# Patient Record
Sex: Female | Born: 1985 | Hispanic: Yes | Marital: Married | Smoking: Never smoker
Health system: Southern US, Community
[De-identification: ages and names within clinical notes are randomized; demographics above are authoritative.]

## PROBLEM LIST (undated history)

## (undated) DIAGNOSIS — G43909 Migraine, unspecified, not intractable, without status migrainosus: Secondary | ICD-10-CM

## (undated) DIAGNOSIS — D649 Anemia, unspecified: Secondary | ICD-10-CM

## (undated) HISTORY — PX: CHOLECYSTECTOMY: SHX55

## (undated) HISTORY — DX: Anemia, unspecified: D64.9

## (undated) HISTORY — DX: Migraine, unspecified, not intractable, without status migrainosus: G43.909

---

## 2011-03-11 ENCOUNTER — Ambulatory Visit: Payer: Self-pay | Admitting: Family Medicine

## 2011-04-10 ENCOUNTER — Encounter: Payer: Self-pay | Admitting: Obstetrics & Gynecology

## 2011-06-16 ENCOUNTER — Observation Stay: Payer: Self-pay | Admitting: Obstetrics and Gynecology

## 2011-06-16 ENCOUNTER — Inpatient Hospital Stay: Payer: Self-pay | Admitting: Obstetrics and Gynecology

## 2013-07-11 ENCOUNTER — Ambulatory Visit: Payer: Self-pay | Admitting: Advanced Practice Midwife

## 2013-08-12 ENCOUNTER — Ambulatory Visit: Payer: Self-pay | Admitting: Family Medicine

## 2013-12-01 ENCOUNTER — Inpatient Hospital Stay: Payer: Self-pay

## 2013-12-01 LAB — CBC WITH DIFFERENTIAL/PLATELET
BASOS PCT: 0.2 %
Basophil #: 0 10*3/uL (ref 0.0–0.1)
EOS ABS: 0 10*3/uL (ref 0.0–0.7)
Eosinophil %: 0.2 %
HCT: 33 % — ABNORMAL LOW (ref 35.0–47.0)
HGB: 11.1 g/dL — ABNORMAL LOW (ref 12.0–16.0)
Lymphocyte #: 0.8 10*3/uL — ABNORMAL LOW (ref 1.0–3.6)
Lymphocyte %: 4.7 %
MCH: 26.6 pg (ref 26.0–34.0)
MCHC: 33.7 g/dL (ref 32.0–36.0)
MCV: 79 fL — AB (ref 80–100)
MONOS PCT: 4.1 %
Monocyte #: 0.7 x10 3/mm (ref 0.2–0.9)
NEUTROS ABS: 15.3 10*3/uL — AB (ref 1.4–6.5)
Neutrophil %: 90.8 %
Platelet: 219 10*3/uL (ref 150–440)
RBC: 4.17 10*6/uL (ref 3.80–5.20)
RDW: 14.3 % (ref 11.5–14.5)
WBC: 16.9 10*3/uL — ABNORMAL HIGH (ref 3.6–11.0)

## 2013-12-01 LAB — HEPATIC FUNCTION PANEL A (ARMC)
ALBUMIN: 2.3 g/dL — AB (ref 3.4–5.0)
Alkaline Phosphatase: 139 U/L — ABNORMAL HIGH
Bilirubin, Direct: <0.1 mg/dL (ref 0.00–0.20)
Bilirubin,Total: 0.3 mg/dL (ref 0.2–1.0)
SGOT(AST): 26 U/L (ref 15–37)
SGPT (ALT): 18 U/L (ref 12–78)
Total Protein: 6 g/dL — ABNORMAL LOW (ref 6.4–8.2)

## 2013-12-01 LAB — LIPASE, BLOOD: Lipase: 118 U/L (ref 73–393)

## 2013-12-02 LAB — HEMOGLOBIN: HGB: 9.8 g/dL — ABNORMAL LOW (ref 12.0–16.0)

## 2014-08-10 DIAGNOSIS — Z98891 History of uterine scar from previous surgery: Secondary | ICD-10-CM | POA: Insufficient documentation

## 2014-10-17 ENCOUNTER — Ambulatory Visit: Payer: Self-pay | Admitting: Advanced Practice Midwife

## 2014-11-09 ENCOUNTER — Encounter: Payer: Self-pay | Admitting: Obstetrics and Gynecology

## 2014-11-13 ENCOUNTER — Encounter: Payer: Self-pay | Admitting: Obstetrics and Gynecology

## 2014-12-06 DIAGNOSIS — Z550 Illiteracy and low-level literacy: Secondary | ICD-10-CM | POA: Insufficient documentation

## 2015-02-07 ENCOUNTER — Inpatient Hospital Stay
Admit: 2015-02-07 | Disposition: A | Discharge status: Home / Self Care | Payer: Medicaid Other | Attending: Obstetrics & Gynecology | Admitting: Obstetrics & Gynecology

## 2015-02-07 LAB — HEMATOCRIT: HCT: 31.4 % — ABNORMAL LOW (ref 35.0–47.0)

## 2015-02-20 NOTE — H&P (Signed)
L&D Evaluation:  History Expanded:  HPI 29 yo G3P1102 HF at 36 weeks w contractions that started at 0300, min bleeding or leakage but unclear if this happened on way to hospital.  Prenatal Care at [facility]; prior Cesarean Section and then VBAC at 34 weeks (precipitous).   Patient presented and within minutes was pushing breech baby, w delivery by RN team prior to arrival of MD.   Rhett BannisterGravida 3   Term 1   PreTerm 1   Abortion 0   Living 2   Blood Type (Maternal) O positive   Group B Strep Results Maternal (Result >5wks must be treated as unknown) unknown/result > 5 weeks ago   [facility]   Presents with contractions   Patient's Medical History No Chronic Illness   Patient's Surgical History Previous C-Section   Medications Pre Natal Vitamins   Allergies NKDA   Social History none   Family History Non-Contributory   ROS:  ROS All systems were reviewed.  HEENT, CNS, GI, GU, Respiratory, CV, Renal and Musculoskeletal systems were found to be normal.   Exam:  Vital Signs stable   General no apparent distress   Mental Status clear   Abdomen TENDER w postpartum CTXS   Edema no edema   Pelvic no external lesions, cervix healthy; 2nd degree lac noted   Skin no lesions   Impression:  Impression Precipitous delivery of breech VBAC   Plan:  Plan Repair laceration, deliver placenta, and monitor for postpartum care   Electronic Signatures: Sandra Maynard, Bastien Strawser Maynard (MD)  (Signed 27-Apr-16 05:14)  Authored: L&D Evaluation   Last Updated: 27-Apr-16 05:14 by Sandra Maynard, Sandra Maynard (MD)

## 2015-02-20 NOTE — H&P (Signed)
L&D Evaluation:  History:  HPI 29 yo G2P1 at 34+6 weeks presents from ed in active labor and urge to push. Previous ltcs for cpd .   Presents with contractions   Patient's Medical History No Chronic Illness   Patient's Surgical History Previous C-Section   Allergies NKDA   Social History none   Family History Non-Contributory   ROS:  ROS All systems were reviewed.  HEENT, CNS, GI, GU, Respiratory, CV, Renal and Musculoskeletal systems were found to be normal.   Exam:  Vital Signs stable   General no apparent distress   Mental Status clear   Chest clear   Heart normal sinus rhythm   Edema no edema   Pelvic c/c +2/ vtx   Mebranes Ruptured   FHT normal rate with no decels   Ucx regular   Impression:  Impression 34+ 6 week previous c/s   Plan:  Plan precipitous svd ( VBAC) preterm   Electronic Signatures: Jelene Albano, Ihor Austinhomas J (MD)  (Signed 19-Feb-15 16:17)  Authored: L&D Evaluation   Last Updated: 19-Feb-15 16:17 by Suzy BouchardSchermerhorn, Kain Milosevic J (MD)

## 2017-12-15 DIAGNOSIS — F1520 Other stimulant dependence, uncomplicated: Secondary | ICD-10-CM | POA: Insufficient documentation

## 2017-12-15 DIAGNOSIS — E669 Obesity, unspecified: Secondary | ICD-10-CM | POA: Insufficient documentation

## 2017-12-15 LAB — HM HIV SCREENING LAB: HM HIV Screening: NEGATIVE

## 2017-12-15 LAB — HM PAP SMEAR: HM Pap smear: NEGATIVE

## 2020-01-06 ENCOUNTER — Ambulatory Visit: Payer: Self-pay | Attending: Internal Medicine

## 2020-01-06 DIAGNOSIS — Z23 Encounter for immunization: Secondary | ICD-10-CM

## 2020-01-06 NOTE — Progress Notes (Signed)
   Covid-19 Vaccination Clinic  Name:  Sandra Maynard    MRN: [id number] DOB: 06/16/1986  01/06/2020  [name] was observed post Covid-19 immunization for 15 minutes without incident. She was provided with Vaccine Information Sheet and instruction to access the V-Safe system.   [name] was instructed to call 911 with any severe reactions post vaccine: Marland Kitchen Difficulty breathing  . Swelling of face and throat  . A fast heartbeat  . A bad rash all over body  . Dizziness and weakness   Immunizations Administered    Name Date Dose VIS Date Route   Pfizer COVID-19 Vaccine 01/06/2020  4:58 PM 0.3 mL 09/23/2019 Intramuscular   Manufacturer: [employer], [employer]   Lot: [id number]   NDC: [address]-0      Covid-19 Vaccination Clinic  Name:  Sandra Maynard    MRN: [id number] DOB: 05/08/1986  01/06/2020  [name] was observed post Covid-19 immunization for 15 minutes without incident. She was provided with Vaccine Information Sheet and instruction to access the V-Safe system.   [name] was instructed to call 911 with any severe reactions post vaccine: Marland Kitchen Difficulty breathing  . Swelling of face and throat  . A fast heartbeat  . A bad rash all over body  . Dizziness and weakness   Immunizations Administered    Name Date Dose VIS Date Route   Pfizer COVID-19 Vaccine 01/06/2020  4:58 PM 0.3 mL 09/23/2019 Intramuscular   Manufacturer: [employer], [employer]   Lot: [id number]   NDC: [address]-4

## 2020-01-27 ENCOUNTER — Ambulatory Visit: Payer: MEDICAID | Attending: Internal Medicine

## 2020-01-27 DIAGNOSIS — Z23 Encounter for immunization: Secondary | ICD-10-CM

## 2020-01-27 NOTE — Progress Notes (Signed)
   Covid-19 Vaccination Clinic  Name:  Sandra Maynard    MRN: [id number] DOB: [birth year]  01/27/2020  Ms. Sandra Maynard was observed post Covid-19 immunization for 15 minutes without incident. She was provided with Vaccine Information Sheet and instruction to access the V-Safe system.   Ms. Sandra Maynard was instructed to call 911 with any severe reactions post vaccine: Marland Kitchen Difficulty breathing  . Swelling of face and throat  . A fast heartbeat  . A bad rash all over body  . Dizziness and weakness   Immunizations Administered    Name Date Dose VIS Date Route   Pfizer COVID-19 Vaccine 01/27/2020 11:41 AM 0.3 mL 09/23/2019 Intramuscular   Manufacturer: [employer], [employer]   Lot: [id number]   NDC: [address]-5     2

## 2020-05-30 DIAGNOSIS — Z6836 Body mass index (BMI) 36.0-36.9, adult: Secondary | ICD-10-CM

## 2020-05-30 DIAGNOSIS — Z98891 History of uterine scar from previous surgery: Secondary | ICD-10-CM

## 2020-05-30 DIAGNOSIS — F1520 Other stimulant dependence, uncomplicated: Secondary | ICD-10-CM

## 2020-05-30 DIAGNOSIS — Z550 Illiteracy and low-level literacy: Secondary | ICD-10-CM

## 2020-05-30 DIAGNOSIS — E669 Obesity, unspecified: Secondary | ICD-10-CM

## 2020-08-06 ENCOUNTER — Ambulatory Visit: Payer: Self-pay

## 2020-08-31 ENCOUNTER — Ambulatory Visit (LOCAL_COMMUNITY_HEALTH_CENTER): Payer: Self-pay | Admitting: Physician Assistant

## 2020-08-31 ENCOUNTER — Ambulatory Visit: Payer: Self-pay

## 2020-08-31 ENCOUNTER — Encounter: Payer: Self-pay | Admitting: Physician Assistant

## 2020-08-31 ENCOUNTER — Other Ambulatory Visit: Payer: Self-pay

## 2020-08-31 VITALS — BP 137/92 | Ht 59.0 in | Wt 179.4 lb

## 2020-08-31 DIAGNOSIS — Z0000 Encounter for general adult medical examination without abnormal findings: Secondary | ICD-10-CM

## 2020-08-31 DIAGNOSIS — Z113 Encounter for screening for infections with a predominantly sexual mode of transmission: Secondary | ICD-10-CM

## 2020-08-31 DIAGNOSIS — Z3046 Encounter for surveillance of implantable subdermal contraceptive: Secondary | ICD-10-CM

## 2020-08-31 DIAGNOSIS — Z3009 Encounter for other general counseling and advice on contraception: Secondary | ICD-10-CM

## 2020-08-31 NOTE — Progress Notes (Signed)
Pt is here for physical and Nexplanon removal. Pt reports desires pregnancy. Pt's BP is 137/92. Pt reports she has headaches that come and go but none at this time. RN counseling for Nexplanon removal completed and consent form reviewed and signed by pt. Pt accepts condoms today.

## 2020-08-31 NOTE — Progress Notes (Signed)
Repeat BP 133/93 and provider made aware. Pt counseled per Sadie Haber, PA verbal order for pt to follow up with PCP for BP and headaches that pt has been having, and pt states understanding. Pt aware that if she changes her mind and desires birth control later on to give [place] a call back so she can RTC and pt states understanding. Provider orders completed.

## 2020-09-01 ENCOUNTER — Encounter: Payer: Self-pay | Admitting: Physician Assistant

## 2020-09-01 NOTE — Addendum Note (Signed)
Addended by: Sadie Haber on: 09/01/2020 12:39 PM   Modules accepted: Orders

## 2020-09-01 NOTE — Progress Notes (Signed)
Family Planning Visit- Repeat Yearly Visit  Subjective:  Sandra Maynard is a 34 y.o. [phone]  being seen today for an well woman visit and to discuss family planning options.    She is currently using Nexplanon for pregnancy prevention. Patient reports she doeswant a pregnancy in the next year. Patient  has Caffeine dependence (HCC); Obesity, unspecified; Low-level of literacy; and History of cesarean section on their problem list.  Chief Complaint  Patient presents with  . Contraception    Physical and Nexplanon removal    Patient reports that she would like her Nexplanon removed and to get pregnant.  States that she also wants to see if by getting her Nexplanon removed she would have fewer headaches.  States that she does have a PCP that follows her for her headaches.  Reports that she has a Rx that she was given at the hospital for her headaches but she does not know the name of the medicine.  Patient also mentions that she will occasionally have blurry vision that is sometimes associated with her headaches and sometimes associated with her being over tired.   Per chart review, CBE due 2022 and pap due 2024.  Patient denies any other concerns.   See flowsheet for other program required questions.   Body mass index is 36.23 kg/m. - Patient is eligible for diabetes screening based on BMI and age >21?  not applicable HA1C ordered? not applicable  Patient reports 1  partner in last year. Desires STI screening?  No - patient declines.   Has patient been screened once for HCV in the past?  No  No results found for: HCVAB  Does the patient have current of drug use, have a partner with drug use, and/or has been incarcerated since last result? No  If yes-- Screen for HCV through [facility]   Does the patient meet criteria for HBV testing? No  Criteria:  -Household, sexual or needle sharing contact with HBV -History of drug use -HIV positive -Those with known Hep  C   Health Maintenance Due  Topic Date Due  . Hepatitis C Screening  Never done  . TETANUS/TDAP  Never done  . INFLUENZA VACCINE  Never done    Review of Systems  All other systems reviewed and are negative.   The following portions of the patient's history were reviewed and updated as appropriate: allergies, current medications, past family history, past medical history, past social history, past surgical history and problem list. Problem list updated.  Objective:   Vitals:   08/31/20 1034  BP: (!) 137/92  Weight: 179 lb 6.4 oz (81.4 kg)  Height: 4\' 11"  (1.499 m)    Physical Exam Vitals and nursing note reviewed.  Constitutional:      General: She is not in acute distress.    Appearance: Normal appearance.  HENT:     Head: Normocephalic and atraumatic.     Mouth/Throat:     Mouth: Mucous membranes are moist.     Pharynx: Oropharynx is clear. No oropharyngeal exudate or posterior oropharyngeal erythema.  Eyes:     Conjunctiva/sclera: Conjunctivae normal.  Cardiovascular:     Rate and Rhythm: Normal rate and regular rhythm.  Pulmonary:     Effort: Pulmonary effort is normal.     Breath sounds: Normal breath sounds.  Abdominal:     Palpations: Abdomen is soft. There is no mass.     Tenderness: There is no abdominal tenderness. There is no guarding or rebound.  Musculoskeletal:     Cervical back: Neck supple. No tenderness.  Skin:    General: Skin is warm and dry.     Findings: No bruising, erythema, lesion or rash.  Neurological:     Mental Status: She is alert and oriented to person, place, and time.  Psychiatric:        Mood and Affect: Mood normal.        Behavior: Behavior normal.        Thought Content: Thought content normal.        Judgment: Judgment normal.       Assessment and Plan:  Sandra Maynard is a 34 y.o. female [phone] presenting to the [facility] Department for an yearly well woman exam/family planning  visit  Contraception counseling: Reviewed all forms of birth control options in the tiered based approach. available including abstinence; over the counter/barrier methods; hormonal contraceptive medication including pill, patch, ring, injection,contraceptive implant, ECP; hormonal and nonhormonal IUDs; permanent sterilization options including vasectomy and the various tubal sterilization modalities. Risks, benefits, and typical effectiveness rates were reviewed.  Questions were answered.  Written information was also given to the patient to review.  Patient desires Nexplanon removal, this was prescribed for patient. She will follow up in  1 year and prn for surveillance.  She was told to call with any further questions, or with any concerns about this method of contraception.  Emphasized use of condoms 100% of the time for STI prevention.  Patient was not a candidate for ECP today.  1. Encounter for counseling regarding contraception Counseled patient that she can RTC at any time if she changes her mind and wants to delay pregnancy. Enc condoms with all sex until has seen PCP for evaluation of elevated BP.  2. Well woman exam (no gynecological exam) Reviewed with patient healthy habits to maintain general health.  Enc patient to decrease caffeine, get regular sleep, eat regular meals and avoid tobacco, EtOH, and other drugs for healthy pregnancy. Enc MVI 1 po daily. Enc to establish with/ follow up with PCP for evaluation of elevated BP, headaches,  primary care concerns, age appropriate screenings and illness.  3. Screening for STD (sexually transmitted disease) Await test results.  Counseled that RN will call if needs to RTC for further treatment once results are back.  - HIV/HCV [place] [facility] - Syphilis Serology, [place] [facility]  4. Nexplanon removal Nexplanon Removal Patient identified, informed consent performed, consent signed.   Appropriate time out taken. Nexplanon site identified.  Area  prepped in usual sterile fashon. 2 ml of 1% lidocaine with Epinephrine was used to anesthetize the area at the distal end of the implant and along implant site. A small stab incision was made right beside the implant on the distal portion.  The Nexplanon rod was grasped manually and removed without difficulty.  There was minimal blood loss. There were no complications.  Steri-strips were applied over the small incision.  A pressure bandage was applied to reduce any bruising.  The patient tolerated the procedure well and was given post procedure instructions.   Nexplanon:   Counseled patient to take OTC analgesic starting as soon as lidocaine starts to wear off and take regularly for at least 48 hr to decrease discomfort.  Specifically to take with food or milk to decrease stomach upset and for IB 600 mg (3 tablets) every 6 hrs; IB 800 mg (4 tablets) every 8 hrs; or Aleve 2 tablets every 12 hrs.  Return for annual and PRN.  No future appointments.  Matt Holmes, PA

## 2020-11-21 ENCOUNTER — Ambulatory Visit (LOCAL_COMMUNITY_HEALTH_CENTER): Payer: Self-pay | Admitting: Physician Assistant

## 2020-11-21 ENCOUNTER — Other Ambulatory Visit: Payer: Self-pay

## 2020-11-21 ENCOUNTER — Encounter: Payer: Self-pay | Admitting: Physician Assistant

## 2020-11-21 VITALS — BP 123/85 | Ht 59.0 in | Wt 177.0 lb

## 2020-11-21 DIAGNOSIS — Z3200 Encounter for pregnancy test, result unknown: Secondary | ICD-10-CM

## 2020-11-21 LAB — PREGNANCY, URINE: Preg Test, Ur: POSITIVE — AB

## 2020-11-21 MED ORDER — PRENATAL VITAMINS 28-0.8 MG PO TABS: 28.0000 mg | ORAL_TABLET | Freq: Every day | ORAL | 0 refills | Status: AC

## 2020-11-21 NOTE — Progress Notes (Signed)
C/o nausea starting today. Mid January was having a lot of pain in lower abdomen. Denies pain with sex. Feels more sleepy but denies any other pregnancy sx's. States had home +PT.Richmond Campbell, RN

## 2020-11-21 NOTE — Progress Notes (Signed)
Patient into clinic and RN evaluation done.  See documentation on pregnancy test flow sheet for visit.  I did not see this patient but was available for consultation if needed.

## 2020-12-10 NOTE — Progress Notes (Signed)
Patient and family history abstracted per Hal Hope phone interview notes.Burt Knack, RN

## 2020-12-11 ENCOUNTER — Other Ambulatory Visit: Payer: Self-pay

## 2020-12-11 ENCOUNTER — Encounter: Payer: Self-pay | Admitting: Advanced Practice Midwife

## 2020-12-11 ENCOUNTER — Ambulatory Visit: Payer: Medicaid Other | Admitting: Advanced Practice Midwife

## 2020-12-11 DIAGNOSIS — O99212 Obesity complicating pregnancy, second trimester: Secondary | ICD-10-CM

## 2020-12-11 DIAGNOSIS — O0992 Supervision of high risk pregnancy, unspecified, second trimester: Secondary | ICD-10-CM

## 2020-12-11 DIAGNOSIS — Z8751 Personal history of pre-term labor: Secondary | ICD-10-CM | POA: Insufficient documentation

## 2020-12-11 DIAGNOSIS — O99340 Other mental disorders complicating pregnancy, unspecified trimester: Secondary | ICD-10-CM

## 2020-12-11 DIAGNOSIS — O0930 Supervision of pregnancy with insufficient antenatal care, unspecified trimester: Secondary | ICD-10-CM | POA: Insufficient documentation

## 2020-12-11 DIAGNOSIS — Z23 Encounter for immunization: Secondary | ICD-10-CM

## 2020-12-11 DIAGNOSIS — O99210 Obesity complicating pregnancy, unspecified trimester: Secondary | ICD-10-CM | POA: Insufficient documentation

## 2020-12-11 DIAGNOSIS — Z98891 History of uterine scar from previous surgery: Secondary | ICD-10-CM

## 2020-12-11 DIAGNOSIS — T148XXA Other injury of unspecified body region, initial encounter: Secondary | ICD-10-CM | POA: Insufficient documentation

## 2020-12-11 DIAGNOSIS — F32A Depression, unspecified: Secondary | ICD-10-CM

## 2020-12-11 DIAGNOSIS — O0932 Supervision of pregnancy with insufficient antenatal care, second trimester: Secondary | ICD-10-CM | POA: Diagnosis not present

## 2020-12-11 HISTORY — DX: Other mental disorders complicating pregnancy, unspecified trimester: O99.340

## 2020-12-11 HISTORY — DX: Depression, unspecified: F32.A

## 2020-12-11 LAB — URINALYSIS
Bilirubin, UA: POSITIVE — AB
Glucose, UA: NEGATIVE
Ketones, UA: NEGATIVE
Nitrite, UA: NEGATIVE
Specific Gravity, UA: 1.03 (ref 1.005–1.030)
Urobilinogen, Ur: 4 mg/dL — ABNORMAL HIGH (ref 0.2–1.0)
pH, UA: 6.5 (ref 5.0–7.5)

## 2020-12-11 LAB — WET PREP FOR TRICH, YEAST, CLUE
Trichomonas Exam: NEGATIVE
Yeast Exam: NEGATIVE

## 2020-12-11 LAB — HEMOGLOBIN, FINGERSTICK: Hemoglobin: 13.4 g/dL (ref 11.1–15.9)

## 2020-12-11 NOTE — Progress Notes (Signed)
[place]et [place], Hgb and UA results all reviewed by provider Hazle Coca, CNM. No further orders given. Tawny Hopping, RN

## 2020-12-11 NOTE — Progress Notes (Signed)
[facility] HEALTH DEPT [facility] [address] [place] [address] [phone]  INITIAL PRENATAL VISIT NOTE  Subjective:  Sandra Maynard is a 35 y.o.MHF [id number] (9,7,5) nonsmoker [redacted]w[redacted]d being seen today to start prenatal care at the [facility] Department. She feels "this pregnancy is not easy" about surprise pregnancy with no birth control.  Living with her husband and 3 kids and unemployed.  69 yo employed husband of 10 years feels "happy" about pregnancy; he is the father of all of her children. LMP 09/01/20.  Denies ER use or u/s this pregnancy.  Denies hx cigs, vaping, cigars. Last ETOH 10/06/20 (2 beers).  Completed 6th grade.  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 Logan Bores); Obesity affecting pregnancy BMI=35.6; Supervision of high risk pregnancy in second trimester; Late prenatal care 14 3/7; History of preterm delivery 12/01/13 34 wks & 02/07/15 at 36; Low birth weight infant 5 lbs 02/07/15; Hx macrosomic infant 9 lbs 06/17/11; Depression affecting pregnancy; and Hemorrhage after delivery of fetus on 06/17/11 500 cc on their problem list.  Patient reports no complaints.  Contractions: Not present. Vag. Bleeding: None.  Movement: Present. Denies leaking of fluid.   Indications for ASA therapy (per uptodate) One of the following: Previous pregnancy with preeclampsia, especially early onset and with an adverse outcome No Multifetal gestation No Chronic hypertension No Type 1 or 2 diabetes mellitus No Chronic kidney disease No Autoimmune disease (antiphospholipid syndrome, systemic lupus erythematosus) No  Two or more of the following: Nulliparity No Obesity (body mass index >30 kg/m2)  Yes Family history of preeclampsia in mother or sister No Age ?35 years No Sociodemographic characteristics (African American race, low socioeconomic level) Yes Personal risk factors (eg, previous pregnancy with low birth weight or small for gestational age infant, previous adverse pregnancy outcome [eg, stillbirth], interval >10 years between pregnancies) No   The following portions of the patient's history were reviewed and updated as appropriate: allergies, current medications, past family history, past medical history, past social history, past surgical history and problem list. Problem list updated.  Objective:   Vitals:   12/11/20 0849  BP: 123/77  Pulse: 78  Temp: (!) 97 F (36.1 C)  Weight: 176 lb 6.4 oz (80 kg)    Fetal Status: Fetal Heart Rate (bpm): 130 Fundal Height: 16 cm Movement: Present  Presentation: Undeterminable   Physical Exam Vitals and nursing note reviewed.  Constitutional:      General: She is not in acute distress.    Appearance: Normal appearance. She is well-developed. She is obese.  HENT:     Head: Normocephalic and atraumatic.     Right Ear: External ear normal.     Left Ear: External ear normal.     Nose: Nose normal. No congestion or rhinorrhea.     Mouth/Throat:     Lips: Pink.     Mouth: Mucous membranes are moist.     Dentition: Normal dentition. No dental caries.     Pharynx: Oropharynx is clear. Uvula midline.     Comments: Dentition: last dental exam 10/2020; no visible caries Eyes:     General: No scleral icterus.    Conjunctiva/sclera: Conjunctivae normal.  Neck:     Thyroid: No thyroid  mass or thyromegaly.     Comments: Thyroid without masses or enlargement Negative cervical lymphadenopathy Last dental exam 10/2020; no visible caries Cardiovascular:     Rate and Rhythm: Normal rate.     Pulses: Normal pulses.     Comments: Extremities are warm and well perfused Pulmonary:     Effort: Pulmonary effort is normal.     Breath  sounds: Normal breath sounds.  Chest:     Chest wall: No mass.  Breasts:     Tanner Score is 5. Breasts are symmetrical.     Right: Normal. No mass, nipple discharge, skin change or axillary adenopathy.     Left: Normal. No mass, nipple discharge, skin change or axillary adenopathy.    Abdominal:     General: Abdomen is flat.     Palpations: Abdomen is soft.     Tenderness: There is no abdominal tenderness.     Comments: Gravid 16 wks size, FHR 130 Poor tone, soft without tenderess  Genitourinary:    General: Normal vulva.     Exam position: Lithotomy position.     Pubic Area: No rash.      Labia:        Right: No rash.        Left: No rash.      Vagina: Vaginal discharge (white creamy moderate amt leukorrhea, ph<4.5) present.     Cervix: Normal.     Uterus: Normal. Enlarged (Gravid 16 wks size). Not tender.      Rectum: Normal. No external hemorrhoid.  Musculoskeletal:     Right lower leg: No edema.     Left lower leg: No edema.  Lymphadenopathy:     Cervical: No cervical adenopathy.     Upper Body:     Right upper body: No axillary adenopathy.     Left upper body: No axillary adenopathy.  Skin:    General: Skin is warm.     Capillary Refill: Capillary refill takes less than 2 seconds.  Neurological:     Mental Status: She is alert.     Assessment and Plan:  Pregnancy: [id number] at [redacted]w[redacted]d  1. Obesity affecting pregnancy, antepartum Counseled on wt gain of 11-20 lbs this pregnancy Agrees to buy and take ASA 81 mg daily  2. Supervision of high risk pregnancy in second trimester Desires Quad screen Anatomy u/s ordered [facility] 18 wks ROI for birthweight of infant born 02/07/15 Need birthweight of 06/17/11 infant  - Glucose, 1 hour gestational - Hgb A1c w/o eAG - HIV Antibody (routine testing w rflx) - Comprehensive metabolic panel - Prenatal profile without Varicella/Rubella ([id number]) - Protein / creatinine ratio, urine  (Spot) - TSH - Urine Culture - Chlamydia/GC  NAA, Confirmation - HCV Ab w/Rflx to Verification - WET PREP FOR TRICH, YEAST, CLUE - Hemoglobin, venipuncture - Urinalysis (Urine Dip) - [id number] Drug Screen - TSH + free T4 - IGP, Aptima HPV  3. Late prenatal care 14 3/7 Too late for FIRST screen  4. History of preterm delivery 02/07/15   5. Low birth weight infant 5 lbs 02/07/15   6. Hx macrosomic infant 9 lbs 06/17/11   7. Depression affecting pregnancy Pt desires apt with Kathreen Cosier, LCSW--please assist with scheduling  8. History of cesarean section Has had 2 successful VBAC's and 1 was SVD breech Wants TOLAC 9. Postpartum hemorrhage After 06/17/11 delivery     Discussed overview of care and coordination with inpatient delivery practices including [facility], [facility], [facility] and [facility].  Reviewed Centering pregnancy as standard of care at [facility], oriented to room and showed video. Based on EDD, plan for Cycle  .    Preterm labor symptoms and general obstetric precautions including but not limited to vaginal bleeding, contractions, leaking of fluid and fetal movement were reviewed in detail with the patient.  Please refer to After Visit Summary for other counseling recommendations.   No follow-ups on file.  No future appointments.  Alberteen Spindle, CNM

## 2020-12-11 NOTE — Progress Notes (Addendum)
Here today for 14.3 week MH IP. Taking PNV QD. Denies ED/hospital visits since +PT. Flu vaccine accepted and administered. 1 hgtt today. Tawny Hopping, RN

## 2020-12-12 LAB — 789231 7+OXYCODONE-BUND
Amphetamines, Urine: NEGATIVE ng/mL
BENZODIAZ UR QL: NEGATIVE ng/mL
Barbiturate screen, urine: NEGATIVE ng/mL
Cannabinoid Quant, Ur: NEGATIVE ng/mL
Cocaine (Metab.): NEGATIVE ng/mL
OPIATE SCREEN URINE: NEGATIVE ng/mL
Oxycodone/Oxymorphone, Urine: NEGATIVE ng/mL
PCP Quant, Ur: NEGATIVE ng/mL

## 2020-12-12 LAB — HCV AB W REFLEX TO QUANT PCR: HCV Ab: <0.1 s/co ratio (ref 0.0–0.9)

## 2020-12-12 LAB — PROTEIN / CREATININE RATIO, URINE
Creatinine, Urine: 196.1 mg/dL
Protein, Ur: 487.3 mg/dL
Protein/Creat Ratio: 2485 mg/g creat — ABNORMAL HIGH (ref 0–200)

## 2020-12-12 LAB — HCV INTERPRETATION

## 2020-12-13 ENCOUNTER — Telehealth: Payer: Self-pay

## 2020-12-13 DIAGNOSIS — R7309 Other abnormal glucose: Secondary | ICD-10-CM | POA: Insufficient documentation

## 2020-12-13 LAB — COMPREHENSIVE METABOLIC PANEL
ALT: 8 IU/L (ref 0–32)
AST: 11 IU/L (ref 0–40)
Albumin/Globulin Ratio: 1.3 (ref 1.2–2.2)
Albumin: 3.7 g/dL — ABNORMAL LOW (ref 3.8–4.8)
Alkaline Phosphatase: 74 IU/L (ref 44–121)
BUN/Creatinine Ratio: 12 (ref 9–23)
BUN: 7 mg/dL (ref 6–20)
Bilirubin Total: 0.2 mg/dL (ref 0.0–1.2)
CO2: 21 mmol/L (ref 20–29)
Calcium: 8.7 mg/dL (ref 8.7–10.2)
Chloride: 100 mmol/L (ref 96–106)
Creatinine, Ser: 0.57 mg/dL (ref 0.57–1.00)
Globulin, Total: 2.8 g/dL (ref 1.5–4.5)
Glucose: 159 mg/dL — ABNORMAL HIGH (ref 65–99)
Potassium: 3.9 mmol/L (ref 3.5–5.2)
Sodium: 137 mmol/L (ref 134–144)
Total Protein: 6.5 g/dL (ref 6.0–8.5)
eGFR: 124 mL/min/{1.73_m2} (ref >59)

## 2020-12-13 LAB — GLUCOSE, 1 HOUR GESTATIONAL: Gestational Diabetes Screen: 136 mg/dL (ref 65–139)

## 2020-12-13 LAB — URINE CULTURE

## 2020-12-13 LAB — CBC/D/PLT+RPR+RH+ABO+AB SCR
Antibody Screen: NEGATIVE
Basophils Absolute: 0.1 10*3/uL (ref 0.0–0.2)
Basos: 1 %
EOS (ABSOLUTE): 0.1 10*3/uL (ref 0.0–0.4)
Eos: 1 %
Hematocrit: 40.9 % (ref 34.0–46.6)
Hemoglobin: 13.6 g/dL (ref 11.1–15.9)
Hepatitis B Surface Ag: NEGATIVE
Immature Grans (Abs): 0.1 10*3/uL (ref 0.0–0.1)
Immature Granulocytes: 1 %
Lymphocytes Absolute: 1.3 10*3/uL (ref 0.7–3.1)
Lymphs: 15 %
MCH: 28.8 pg (ref 26.6–33.0)
MCHC: 33.3 g/dL (ref 31.5–35.7)
MCV: 87 fL (ref 79–97)
Monocytes Absolute: 0.4 10*3/uL (ref 0.1–0.9)
Monocytes: 5 %
Neutrophils Absolute: 6.5 10*3/uL (ref 1.4–7.0)
Neutrophils: 77 %
Platelets: 283 10*3/uL (ref 150–450)
RBC: 4.72 x10E6/uL (ref 3.77–5.28)
RDW: 11.8 % (ref 11.7–15.4)
RPR Ser Ql: NONREACTIVE
Rh Factor: POSITIVE
WBC: 8.3 10*3/uL (ref 3.4–10.8)

## 2020-12-13 LAB — CHLAMYDIA/GC NAA, CONFIRMATION
Chlamydia trachomatis, NAA: NEGATIVE
Neisseria gonorrhoeae, NAA: NEGATIVE

## 2020-12-13 LAB — TSH+FREE T4
Free T4: 1.26 ng/dL (ref 0.82–1.77)
TSH: 2.08 u[IU]/mL (ref 0.450–4.500)

## 2020-12-13 LAB — HGB A1C W/O EAG: Hgb A1c MFr Bld: 5.4 % (ref 4.8–5.6)

## 2020-12-13 LAB — HIV-1/HIV-2 QUALITATIVE RNA
HIV-1 RNA, Qualitative: NONREACTIVE
HIV-2 RNA, Qualitative: NONREACTIVE

## 2020-12-13 NOTE — Telephone Encounter (Signed)
Calling pt regarding Abnormal 1 hour (136) and to schedule 3 hour Gtt.

## 2020-12-13 NOTE — Telephone Encounter (Signed)
Phone call to pt with interpreter Marlene Yemen. Left message on voicemail that RN with [facility] is calling about result of 1 hour glucose or sugar check, we need to have her come back for additional lab, please call us back to schedule a lab appt. Can call [phone].

## 2020-12-13 NOTE — Telephone Encounter (Signed)
Clerical received call back from pt. Pt scheduled for 3 hour on 12/21/20 per pt request, only had 10:50 appt available. Pt given instructions for 3 hour GTT. Pt to arrive at 8:00 AM; pt also counseled about the sequence of events and instructions for once she gets to [facility] that morning.

## 2020-12-14 ENCOUNTER — Encounter: Payer: Self-pay | Admitting: Advanced Practice Midwife

## 2020-12-14 DIAGNOSIS — O1210 Gestational proteinuria, unspecified trimester: Secondary | ICD-10-CM | POA: Insufficient documentation

## 2020-12-15 ENCOUNTER — Telehealth: Payer: Self-pay

## 2020-12-15 NOTE — Telephone Encounter (Signed)
Per result note from E. Sciora CNM, 24 hour urine needed ASAP and client needs 3 hour GTT. Call to client with [facility] [employer] ID # [phone] and left message requesting she call at 8 am Monday am 12/17/2020 as needs additional lab testing. Number to call provided. Jossie Ng, RN .

## 2020-12-17 LAB — IGP, APTIMA HPV
HPV Aptima: NEGATIVE
PAP Smear Comment: 0

## 2020-12-17 NOTE — Telephone Encounter (Signed)
Call to client with [facility] [employer] ID # [phone] and 3 hour GTT rescheduled for tomorrow am. Test prep instructions verbally reviewed with stated understanding. Client correctly verbalized nothing to eat or drink after 8 pm tonight except for small sips of plain water. Counseled regarding need for 24 hour urine collection ASAP and to retrieve supplies for collection at appt in am. Jossie Ng, RN

## 2020-12-17 NOTE — Telephone Encounter (Signed)
Call to client with [facility] [employer] ID # [phone] as needs 1) 24 hour urine ASAP and 2) 3 hour GTT. After interpreter identified self and reason for call, client disconnected call pre interpreter. Re-dialed number and left message for call with number to call provided. Jossie Ng, RN

## 2020-12-18 ENCOUNTER — Other Ambulatory Visit: Payer: Self-pay

## 2020-12-18 ENCOUNTER — Other Ambulatory Visit: Payer: Medicaid Other

## 2020-12-18 DIAGNOSIS — R7309 Other abnormal glucose: Secondary | ICD-10-CM

## 2020-12-18 NOTE — Progress Notes (Signed)
Here for 3 hr GTT today. Npo since 6 pm last night. Instructions given for 3hr GTT lab today. RN also explained instructions for 24 hr urine collection. Urine supplies given. Pt to begin 24 hr urine collection tomorrow (12/19/2020) and has appt to deliver 24 hr urine collection on 12/20/2020 at 9 am. Pt repeated instructions for 24 hr urine collection. Questions answered and reports understanding. Lang. Line, interpreter today. Jerel Shepherd, RN

## 2020-12-19 ENCOUNTER — Telehealth: Payer: Self-pay

## 2020-12-19 LAB — GLUCOSE TOLERANCE TEST, 6 HOUR
Glucose, 1 Hour GTT: 147 mg/dL (ref 65–199)
Glucose, 2 hour: 151 mg/dL — ABNORMAL HIGH (ref 65–139)
Glucose, 3 hour: 125 mg/dL — ABNORMAL HIGH (ref 65–109)
Glucose, GTT - Fasting: 85 mg/dL (ref 65–99)

## 2020-12-19 NOTE — Progress Notes (Signed)
Call received from Peggy at [facility] Maternal Serum Screening Program. Per Luther Bradley screen drawn at [facility] = 14 4/7 on 12/11/2020 and needs to be recalculated if [place] changes date or if true EGA, redraw Quad screen. Jossie Ng, RN

## 2020-12-19 NOTE — Telephone Encounter (Signed)
Call to client and verified aware of [facility] [place] on 01/14/2021 at 1100. [employer] ID# [phone] assisted with call. Jossie Ng, RN

## 2020-12-20 ENCOUNTER — Other Ambulatory Visit: Payer: Self-pay

## 2020-12-20 ENCOUNTER — Other Ambulatory Visit: Payer: Medicaid Other

## 2020-12-20 ENCOUNTER — Emergency Department
Admission: EM | Admit: 2020-12-20 | Discharge: 2020-12-20 | Disposition: A | Discharge status: Home / Self Care | Payer: Self-pay | Attending: Emergency Medicine | Admitting: Emergency Medicine

## 2020-12-20 ENCOUNTER — Telehealth: Payer: Self-pay

## 2020-12-20 DIAGNOSIS — O219 Vomiting of pregnancy, unspecified: Secondary | ICD-10-CM | POA: Insufficient documentation

## 2020-12-20 DIAGNOSIS — Z3490 Encounter for supervision of normal pregnancy, unspecified, unspecified trimester: Secondary | ICD-10-CM

## 2020-12-20 DIAGNOSIS — R42 Dizziness and giddiness: Secondary | ICD-10-CM | POA: Insufficient documentation

## 2020-12-20 DIAGNOSIS — Z3A17 17 weeks gestation of pregnancy: Secondary | ICD-10-CM | POA: Insufficient documentation

## 2020-12-20 DIAGNOSIS — O1210 Gestational proteinuria, unspecified trimester: Secondary | ICD-10-CM

## 2020-12-20 DIAGNOSIS — R112 Nausea with vomiting, unspecified: Secondary | ICD-10-CM

## 2020-12-20 DIAGNOSIS — O26812 Pregnancy related exhaustion and fatigue, second trimester: Secondary | ICD-10-CM | POA: Insufficient documentation

## 2020-12-20 LAB — BASIC METABOLIC PANEL
Anion gap: 8 (ref 5–15)
BUN: 12 mg/dL (ref 6–20)
CO2: 25 mmol/L (ref 22–32)
Calcium: 9 mg/dL (ref 8.9–10.3)
Chloride: 103 mmol/L (ref 98–111)
Creatinine, Ser: 0.56 mg/dL (ref 0.44–1.00)
GFR, Estimated: >60 mL/min (ref >60)
Glucose, Bld: 100 mg/dL — ABNORMAL HIGH (ref 70–99)
Potassium: 4 mmol/L (ref 3.5–5.1)
Sodium: 136 mmol/L (ref 135–145)

## 2020-12-20 LAB — CBC WITH DIFFERENTIAL/PLATELET
Abs Immature Granulocytes: 0.03 10*3/uL (ref 0.00–0.07)
Basophils Absolute: 0 10*3/uL (ref 0.0–0.1)
Basophils Relative: 0 %
Eosinophils Absolute: 0.1 10*3/uL (ref 0.0–0.5)
Eosinophils Relative: 1 %
HCT: 39.2 % (ref 36.0–46.0)
Hemoglobin: 13.2 g/dL (ref 12.0–15.0)
Immature Granulocytes: 0 %
Lymphocytes Relative: 19 %
Lymphs Abs: 1.4 10*3/uL (ref 0.7–4.0)
MCH: 29 pg (ref 26.0–34.0)
MCHC: 33.7 g/dL (ref 30.0–36.0)
MCV: 86.2 fL (ref 80.0–100.0)
Monocytes Absolute: 0.6 10*3/uL (ref 0.1–1.0)
Monocytes Relative: 7 %
Neutro Abs: 5.6 10*3/uL (ref 1.7–7.7)
Neutrophils Relative %: 73 %
Platelets: 291 10*3/uL (ref 150–400)
RBC: 4.55 MIL/uL (ref 3.87–5.11)
RDW: 12.1 % (ref 11.5–15.5)
WBC: 7.7 10*3/uL (ref 4.0–10.5)
nRBC: 0 % (ref 0.0–0.2)

## 2020-12-20 MED ORDER — ONDANSETRON 4 MG PO TBDP: 4.0000 mg | ORAL_TABLET | Freq: Three times a day (TID) | ORAL | 0 refills | Status: DC | PRN

## 2020-12-20 MED ORDER — SODIUM CHLORIDE 0.9 % IV BOLUS
1000.0000 mL | Freq: Once | INTRAVENOUS | Status: AC
  Administered 2020-12-20: 1000 mL via INTRAVENOUS

## 2020-12-20 MED ORDER — ONDANSETRON HCL 4 MG/2ML IJ SOLN
4.0000 mg | Freq: Once | INTRAMUSCULAR | Status: AC
  Administered 2020-12-20: 4 mg via INTRAVENOUS
  Filled 2020-12-20: qty 2

## 2020-12-20 NOTE — Discharge Instructions (Signed)
Follow-up with [facility] department if any continued problems.  Medication was sent to your pharmacy.  Also try sipping on clear liquids small amounts frequently.

## 2020-12-20 NOTE — ED Provider Notes (Signed)
[facility] Emergency Department Provider Note  ____________________________________________   Event Date/Time   First MD Initiated Contact with Patient 12/20/20 1144     (approximate)  I have reviewed the triage vital signs and the nursing notes.   HISTORY  Chief Complaint vomiting    HPI Sandra Maynard is a 35 y.o. female presents to the ED with complaint of nausea and vomiting that started yesterday.  Patient states that she has been unable to keep anything down.  She reports vomiting 6 times yesterday and once today.  She also states that with standing she does get dizzy.  She denies any fever, chills, or diarrhea.  She denies any urinary symptoms, body aches or headache.  Patient is currently being seen at [facility] department for her prenatal care and is approximately 4 months pregnant.  She states that she was not given any medication for nausea when she was at the health department.  Last BM was this morning and reported normal.         Past Medical History:  Diagnosis Date  . Anemia    2012  . 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 Migraine     Patient Active Problem List   Diagnosis Date Noted  . Proteinuria affecting pregnancy 12/11/20 spot protein/creat ratio=2,485 12/14/2020  . Abnormal 1 hour glucola on 12/11/20=136 12/13/2020  . Obesity affecting pregnancy BMI=35.6 12/11/2020  . Supervision of high risk pregnancy in second trimester 12/11/2020  . Late prenatal care 14 3/7 12/11/2020  . History of preterm delivery 12/01/13 34 wks & 02/07/15 at 36 12/11/2020  . Low birth weight infant 5 lbs 02/07/15 12/11/2020  . Hx macrosomic infant 9 lbs 06/17/11 12/11/2020  . 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 Hemorrhage after delivery of fetus on 06/17/11 500 cc 12/11/2020  . 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 History  of cesarean section (flat/narrow AP pelvis per Dr. Logan Bores) 08/10/2014    Past Surgical History:  Procedure Laterality Date  . CESAREAN SECTION  2012    Prior to Admission medications   Medication Sig Start Date End Date Taking? Authorizing Provider  ondansetron (ZOFRAN ODT) 4 MG disintegrating tablet Take 1 tablet (4 mg total) by mouth every 8 (eight) hours as needed for nausea or vomiting. 12/20/20  Yes Tommi Rumps, PA-C  Prenatal Vit-Fe Fumarate-FA (PRENATAL VITAMINS) 28-0.8 MG TABS Take 28 mg by mouth daily. 11/21/20 02/19/21  Matt Holmes, PA    Allergies Patient has no known allergies.  Family History  Problem Relation Age of Onset  . Hypertension Mother     Social History Social History   Tobacco Use  . Smoking status: Never Smoker  . Smokeless tobacco: Never Used  . Tobacco comment: Denies secondhand smoke exposure  Vaping Use  . Vaping Use: Never used  Substance Use Topics  . Alcohol use: Never  . Drug use: Never    Review of Systems Constitutional: No fever/chills Eyes: No visual changes. ENT: No sore throat. Cardiovascular: Denies chest pain. Respiratory: Denies shortness of breath. Gastrointestinal: No abdominal pain.  Positive nausea, positive vomiting.  No diarrhea.  No constipation. Genitourinary: Negative for dysuria. Musculoskeletal: Negative for muscle aches. Skin: Negative for rash. Neurological: Negative for headaches, focal weakness or numbness.  ____________________________________________   PHYSICAL EXAM:  VITAL SIGNS: ED Triage Vitals  Enc Vitals Group  BP 12/20/20 1047 140/82     Pulse Rate 12/20/20 1047 84     Resp 12/20/20 1047 18     Temp 12/20/20 1047 98 F (36.7 C)     Temp src --      SpO2 12/20/20 1047 100 %     Weight 12/20/20 1131 176 lb 5.9 oz (80 kg)     Height 12/20/20 1131 4\' 11"  (1.499 m)     Head Circumference --      Peak Flow --      Pain Score 12/20/20 1052 0     Pain Loc --      Pain Edu? --       Excl. in GC? --     Constitutional: Alert and oriented. Well appearing and in no acute distress. Eyes: Conjunctivae are normal.  Head: Atraumatic. Nose: No congestion/rhinnorhea. Mouth/Throat: Mucous membranes are moist.   Neck: No stridor.   Cardiovascular: Normal rate, regular rhythm. Grossly normal heart sounds.  Good peripheral circulation. Respiratory: Normal respiratory effort.  No retractions. Lungs CTAB. Gastrointestinal: Soft and nontender. No distention.  No CVA tenderness.  Bowel sounds normoactive x4 quadrants. Musculoskeletal: No lower extremity tenderness nor edema.  No joint effusions. Neurologic:  Normal speech and language. No gross focal neurologic deficits are appreciated. No gait instability. Skin:  Skin is warm, dry and intact. No rash noted. Psychiatric: Mood and affect are normal. Speech and behavior are normal.  ____________________________________________   LABS (all labs ordered are listed, but only abnormal results are displayed)  Labs Reviewed  BASIC METABOLIC PANEL - Abnormal; Notable for the following components:      Result Value   Glucose, Bld 100 (*)    All other components within normal limits  CBC WITH DIFFERENTIAL/PLATELET     PROCEDURES  Procedure(s) performed (including Critical Care):  Procedures   ____________________________________________   INITIAL IMPRESSION / ASSESSMENT AND PLAN / ED COURSE  As part of my medical decision making, I reviewed the following data within the electronic MEDICAL RECORD NUMBER Notes from prior ED visits and [place] Controlled Substance Database  35 year old female presents to the ED for nausea and vomiting due to pregnancy.  Patient states that currently she is getting her prenatal care at the [facility] department but was not given anything for nausea.  She has been unable to keep food and fluids down for the last 24 hours.  She denies any diarrhea or any other viral symptoms.  Patient was given IV  fluids and Zofran 4 mg while in the ED.  Prior to discharge patient was able to drink fluids and eat crackers without any problems.  A prescription for Zofran was sent to the pharmacy.  She is encouraged to call the health department to see if she needs to be seen or if they can call in medication for her.  Lab work was reassuring and physical exam was benign.  Patient was feeling much better prior to discharge. ____________________________________________   FINAL CLINICAL IMPRESSION(S) / ED DIAGNOSES  Final diagnoses:  Non-intractable vomiting with nausea, unspecified vomiting type  Pregnancy, unspecified gestational age     ED Discharge Orders         Ordered    ondansetron (ZOFRAN ODT) 4 MG disintegrating tablet  Every 8 hours PRN        12/20/20 1322          *Please note:  Anjela Cassara was evaluated in Emergency Department on 12/20/2020 for the symptoms described  in the history of present illness. She was evaluated in the context of the global COVID-19 pandemic, which necessitated consideration that the patient might be at risk for infection with the SARS-CoV-2 virus that causes COVID-19. Institutional protocols and algorithms that pertain to the evaluation of patients at risk for COVID-19 are in a state of rapid change based on information released by regulatory bodies including the CDC and federal and state organizations. These policies and algorithms were followed during the patient's care in the ED.  Some ED evaluations and interventions may be delayed as a result of limited staffing during and the pandemic.*   Note:  This document was prepared using Dragon voice recognition software and may include unintentional dictation errors.    Tommi Rumps, PA-C 12/20/20 1448    Shaune Pollack, MD 12/21/20 [phone]

## 2020-12-20 NOTE — ED Notes (Signed)
Fetal HR-125

## 2020-12-20 NOTE — Telephone Encounter (Signed)
Client with 0840 appt this am to return 24 hour urine. At 0915, phone call made to client with [facility] Interpreters ID # [phone] as not yet at [facility]. Per interpreter, call was answered and then disconnected. Number re-dialed and message left to call [facility] regarding appt. However, during call, client presented to [facility] for appt and message left to disregard call. Jossie Ng, RN

## 2020-12-20 NOTE — Progress Notes (Signed)
In Nurse clinic today with husband to return 24 hr urine collection. Urine to lab, verified name, date, and times. Pt states urine has been refrigerated.  Pt reports she collected each urine starting 12/19/2020 at  6:20 am and completed at 6:20 am 12/20/2020. Pt says she only voided 4x.  Reports vomited 5-6x yesterday and only drank a "little water, juice, milk". Did not eat yesterday and feels "weak" today. Consult Russella Dar, PA-C who recommends pt to go to [facility]- ER now for evaluation. RN explained this to pt and she is in agreement and plans to go directly to ER. M. [place], interpreter today. Jerel Shepherd, RN

## 2020-12-20 NOTE — ED Notes (Signed)
See triage note  Presents with n/v   States she is about 4 month preg and has had vomiting   States she has had vomiting during the pregnancy but this is worse  She is also weak  Denies any pain,fever or vaginal bleeding

## 2020-12-20 NOTE — ED Notes (Signed)
Provided water and saltine crackers to pt.

## 2020-12-20 NOTE — ED Triage Notes (Signed)
Pt comes with c/o vomiting and nausea that started yesterday. Pt states she hasn't been able to keep anything down.  Pt is 4 months pregnant.   Pt denies any baby issues. Pt states some indigestion and burning from vomiting.

## 2020-12-21 ENCOUNTER — Other Ambulatory Visit: Payer: Self-pay

## 2020-12-21 LAB — PROTEIN, URINE, 24 HOUR
Protein, 24H Urine: 2180 mg/24 hr — ABNORMAL HIGH (ref 30–150)
Protein, Ur: 379.2 mg/dL

## 2020-12-25 ENCOUNTER — Telehealth: Payer: Self-pay | Admitting: Student

## 2020-12-25 NOTE — Telephone Encounter (Signed)
TC to patient LM in spanish, informing about U/S date, time and location. Instructed to [facility] to clinic. Sharlyne Pacas, RN

## 2020-12-26 ENCOUNTER — Telehealth: Payer: Self-pay | Admitting: Student

## 2020-12-26 ENCOUNTER — Telehealth: Payer: Self-pay | Admitting: Family Medicine

## 2020-12-26 NOTE — Telephone Encounter (Signed)
TC to patient, LM in spanish to inform about U/S apt scheduled. Details given. Instructed to return call. Sharlyne Pacas, RN

## 2020-12-26 NOTE — Telephone Encounter (Signed)
Phone call to pt with interpreter Salli Real. Pt given information for [facility] Maternal Fetal consult scheduled for 01/01/21 at 10:00. Pt expresses understanding.

## 2020-12-26 NOTE — Telephone Encounter (Signed)
Pt returned missed phone call. Please call again.

## 2020-12-27 ENCOUNTER — Telehealth: Payer: Self-pay

## 2020-12-27 NOTE — Telephone Encounter (Cosign Needed)
Phone call to pt at [phone] with [facility] [place] interpreter. Pt counseled that the consulting provider needs to do detailed U/S and consult afterwards, cancelled 01/01/21 (consult only) and 01/14/21 appts (U/S only) --- 01/15/21 appt was scheduled for both, arrive at 3:45 pm at [facility] at [facility]. Pt confirms understanding of appt changes.  Provider, Arnetha Courser, copied on this phone note.  Referral for MFM Consult changed from 01/01/21 consult only appt to 01/15/21 consult with detailed U/S.  Discussed U/S appts with Rubin Payor, RN currently scheduled in Epic (ordering provider not available today). 01/14/21 U/S should be cancelled due to detailed U/S being done on 01/15/21 with consult after.  Received Chat from Roxy Horseman, RN on 12/27/20: Sandra Maynard! It looks like this pt had a referral sent to [place] and it was only scheduled as a consult per the referral form. She will also require an ultrasound with that appt so I had to move her appointment to accommodate that in our schedule. I saw you were able to contact her yesterday with your interpreter to confirm the original appointment. Can you have the interpreter call her back and let her know that the original appointment did not have an ultrasound with the consult so we had to move her to a different date/time. You should be able to see the new appt date and time in epic and let her know to be here 15 minutes prior to appointment time. [facility] [employer], [address] @ [facility]. She can bring 1 person with her over 18 years. Thank you!  Sandra Maynard confirmed that the appt she is referring to is the one on 01/15/21, arrive at 3:45.

## 2021-01-01 ENCOUNTER — Ambulatory Visit: Payer: MEDICAID

## 2021-01-07 ENCOUNTER — Other Ambulatory Visit: Payer: Self-pay | Admitting: Advanced Practice Midwife

## 2021-01-07 DIAGNOSIS — O99212 Obesity complicating pregnancy, second trimester: Secondary | ICD-10-CM

## 2021-01-07 DIAGNOSIS — Z98891 History of uterine scar from previous surgery: Secondary | ICD-10-CM

## 2021-01-08 ENCOUNTER — Other Ambulatory Visit: Payer: Self-pay

## 2021-01-08 ENCOUNTER — Ambulatory Visit: Payer: Medicaid Other | Admitting: Family Medicine

## 2021-01-08 ENCOUNTER — Encounter: Payer: Self-pay | Admitting: Family Medicine

## 2021-01-08 VITALS — BP 121/89 | HR 84 | Temp 97.4°F | Wt 174.0 lb

## 2021-01-08 DIAGNOSIS — O0992 Supervision of high risk pregnancy, unspecified, second trimester: Secondary | ICD-10-CM | POA: Diagnosis not present

## 2021-01-08 DIAGNOSIS — O1210 Gestational proteinuria, unspecified trimester: Secondary | ICD-10-CM

## 2021-01-08 DIAGNOSIS — Z8751 Personal history of pre-term labor: Secondary | ICD-10-CM | POA: Diagnosis not present

## 2021-01-08 LAB — URINALYSIS
Bilirubin, UA: POSITIVE — AB
Glucose, UA: NEGATIVE
Nitrite, UA: NEGATIVE
Specific Gravity, UA: 1.03 (ref 1.005–1.030)
Urobilinogen, Ur: 4 mg/dL — ABNORMAL HIGH (ref 0.2–1.0)
pH, UA: 7 (ref 5.0–7.5)

## 2021-01-08 LAB — URINALYSIS, MICROSCOPIC ONLY
Casts: NONE SEEN /lpf
Crystals: NONE SEEN
Epithelial Cells (non renal): >10 /hpf — AB (ref 0–10)
Trichomonas, UA: NONE SEEN
Yeast, UA: NONE SEEN

## 2021-01-08 NOTE — Progress Notes (Signed)
Urine dip reviewed by Elveria Rising FNP-BC and urine cultrue with microscopic ordered. Jossie Ng, RN  Micro reviewed by Elveria Rising FNP-BC. Jossie Ng, RN

## 2021-01-08 NOTE — Progress Notes (Addendum)
RN spoke spanish to patient.   Patient reports she is not taking ASA.   Patient is interested in getting QUAD screen redrawn, consent signed today.   Gave patient LCSW card to make appointment.   Patient aware of appointment on 4/5 at 1600 for [place] at [facility] and also alware of 4/5 apt at 0900 for [facility] nephrology. Reminder card with addresses given to pt in regards to both appointments.   Patient interested in BTL, patient on presumptive medicaid. Discussed with patient that presumptive medicaid does not cover BTL but gave patient number to [facility] financial resources to call.

## 2021-01-09 ENCOUNTER — Other Ambulatory Visit: Payer: Self-pay

## 2021-01-09 LAB — COMPREHENSIVE METABOLIC PANEL
ALT: 9 IU/L (ref 0–32)
AST: 14 IU/L (ref 0–40)
Albumin/Globulin Ratio: 1.4 (ref 1.2–2.2)
Albumin: 3.9 g/dL (ref 3.8–4.8)
Alkaline Phosphatase: 65 IU/L (ref 44–121)
BUN/Creatinine Ratio: 12 (ref 9–23)
BUN: 6 mg/dL (ref 6–20)
Bilirubin Total: 0.3 mg/dL (ref 0.0–1.2)
CO2: 20 mmol/L (ref 20–29)
Calcium: 8.9 mg/dL (ref 8.7–10.2)
Chloride: 99 mmol/L (ref 96–106)
Creatinine, Ser: 0.52 mg/dL — ABNORMAL LOW (ref 0.57–1.00)
Globulin, Total: 2.7 g/dL (ref 1.5–4.5)
Glucose: 86 mg/dL (ref 65–99)
Potassium: 4.1 mmol/L (ref 3.5–5.2)
Sodium: 135 mmol/L (ref 134–144)
Total Protein: 6.6 g/dL (ref 6.0–8.5)
eGFR: 127 mL/min/{1.73_m2} (ref >59)

## 2021-01-10 LAB — URINE CULTURE

## 2021-01-10 NOTE — Progress Notes (Signed)
   PRENATAL VISIT NOTE  Subjective:  Sandra Maynard is a 35 y.o. [phone] at [redacted]w[redacted]d being seen today for ongoing prenatal care.  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  Patient reports no complaints.  Contractions: Not present. Vag. Bleeding: None.  Movement: Present. Denies leaking of fluid/ROM.   The following portions of the patient's history were reviewed and updated as appropriate: allergies, current medications, past family history, past medical history, past social history, past surgical history and problem list. Problem list updated.  Objective:   Vitals:   01/08/21 0918  BP: 121/89  Pulse: 84  Temp: (!) 97.4 F (36.3 C)  Weight: 174 lb (78.9 kg)    Fetal Status: Fetal Heart Rate (bpm): 128 Fundal Height: 17 cm Movement: Present     General:  Alert, oriented and cooperative. Patient is in no acute distress.  Skin: Skin is warm and dry. No rash noted.   Cardiovascular: Normal heart rate noted  Respiratory: Normal respiratory effort, no problems with respiration noted  Abdomen: Soft, gravid, appropriate for gestational age.  Pain/Pressure: Absent     Pelvic: Cervical exam deferred        Extremities: Normal range of motion.  Edema: None  Mental Status: Normal mood and affect. Normal behavior.  Normal judgment and thought content.   Assessment and Plan:  Pregnancy: [id number] at [redacted]w[redacted]d  1. Supervision of high risk pregnancy in second trimester Patient went to ER for vomiting, patient was given Zofran and reports doing better.    Patient is not taking ASA   Quad was drawn to soon, re drawn today.   Patient as U/S consult with [facility] MFM for detailed U/D on 01/15/21.  - Urinalysis (Urine Dip) - Comprehensive metabolic panel - QUAD Screen [facility] Only    2. History of preterm delivery 12/01/13 34 wks & 02/07/15 at 36 Patient declined 17P initially.  Discussed with patient the importance of 17P to prevent PTL. Questions answered.  Patient decided to get 17 P.  MH RN notified so that 17 P can be ordered .    3. Proteinuria affecting pregnancy, antepartum Patient has appointment with [facility] on 01/15/21.  Abnormal Dip and Micro results.   - Urine Culture & Sensitivity - Urine Microscopic   Preterm labor symptoms and general obstetric precautions including but not limited to vaginal bleeding, contractions, leaking of fluid and fetal movement were reviewed in detail with the patient. Please refer to After Visit Summary for other counseling recommendations.  No follow-ups on file.  Future Appointments  Date Time Provider Department Center  01/15/2021  2:00 PM [facility] US1 [facility]  02/05/2021  8:40 AM [facility] PROVIDER [facility] None   M. Yemen  used for [place] interpretation.     Wendi Snipes, FNP

## 2021-01-14 ENCOUNTER — Other Ambulatory Visit: Payer: Self-pay

## 2021-01-15 ENCOUNTER — Other Ambulatory Visit: Payer: Self-pay

## 2021-01-15 ENCOUNTER — Other Ambulatory Visit: Payer: Self-pay | Admitting: Advanced Practice Midwife

## 2021-01-15 ENCOUNTER — Institutional Professional Consult (permissible substitution): Payer: Self-pay

## 2021-01-15 ENCOUNTER — Ambulatory Visit: Payer: Medicaid Other | Attending: Obstetrics

## 2021-01-15 DIAGNOSIS — Z3A14 14 weeks gestation of pregnancy: Secondary | ICD-10-CM | POA: Insufficient documentation

## 2021-01-15 DIAGNOSIS — O99212 Obesity complicating pregnancy, second trimester: Secondary | ICD-10-CM

## 2021-01-15 DIAGNOSIS — O34219 Maternal care for unspecified type scar from previous cesarean delivery: Secondary | ICD-10-CM | POA: Diagnosis not present

## 2021-01-15 DIAGNOSIS — Z98891 History of uterine scar from previous surgery: Secondary | ICD-10-CM | POA: Insufficient documentation

## 2021-01-15 DIAGNOSIS — O1212 Gestational proteinuria, second trimester: Secondary | ICD-10-CM | POA: Diagnosis not present

## 2021-01-15 DIAGNOSIS — O0932 Supervision of pregnancy with insufficient antenatal care, second trimester: Secondary | ICD-10-CM | POA: Insufficient documentation

## 2021-01-15 NOTE — Progress Notes (Unsigned)
MFM Consult Note  This patient was seen for an ultrasound exam due to significant proteinuria that was noted during her prenatal visits.  The patient had a 24-hour urine collected earlier in her current pregnancy that showed 2180 mg of protein.  Her serum creatinine level was 0.57 (within normal limits).  The patient's liver function tests and platelet counts were within normal limits.  Her 1 hour glucose challenge test was elevated.  She has a 3-hour glucose tolerance test pending.  Her hemoglobin A1c was 5.4% (within normal limits).  The patient denies any history of kidney disease or high blood pressure.  She was seen by [facility] nephrology today.  They report that the cause of her significant proteinuria remains undetermined.  They will order a renal ultrasound for her soon.  This is the patient's fourth pregnancy.  She reports three prior uncomplicated full-term deliveries (NSVD x2, C-section x1).  She denies any history of high blood pressure or preeclampsia in her prior pregnancies.  The patient had blood work drawn for quad screen a few weeks ago.  However, she was too early in her pregnancy for the quad screen to show any results.  The fetal biometry measurements obtained today are consistent with an [facility] of July 10, 2021, making her only 14 weeks and 6 days today.  This finding would be consistent with her quad screen results.  The patient was advised that as there is a 4-week difference between the [facility] based on her LMP and that based on today's ultrasound, we are changing her EDC to July 10, 2021.  The views of the fetal anatomy were limited today due to her early gestational age.  The patient should have a quad screen drawn at around 16 weeks to screen for fetal aneuploidy.    We have scheduled a detailed fetal anatomy scan for her at around 19 weeks.  The increased risk of preeclampsia and possible fetal growth issues associated with significant proteinuria early in pregnancy was  discussed.  She was advised to start taking a daily baby aspirin (81 mg daily) for preeclampsia prophylaxis.  We will continue to follow her with monthly growth ultrasounds following her detailed fetal anatomy scan.  The patient and her husband were reassured that most patients that I have seen with significant proteinuria but without chronic hypertension or abnormal serum creatinine levels, have had normal and successful pregnancy outcomes.  All conversations were held with the patient today with the help of a Spanish interpreter.  Total time spent in consultation : 40 minutes  Recommendations:  Change EDC to July 10, 2021  Start baby aspirin (81 mg) daily Quad screen to be drawn at around 16 weeks Detailed fetal anatomy scan at 19 Monthly growth ultrasounds

## 2021-01-16 ENCOUNTER — Encounter: Payer: Self-pay | Admitting: Advanced Practice Midwife

## 2021-01-16 DIAGNOSIS — O0992 Supervision of high risk pregnancy, unspecified, second trimester: Secondary | ICD-10-CM

## 2021-01-16 DIAGNOSIS — O1210 Gestational proteinuria, unspecified trimester: Secondary | ICD-10-CM

## 2021-01-16 DIAGNOSIS — O99210 Obesity complicating pregnancy, unspecified trimester: Secondary | ICD-10-CM

## 2021-01-18 ENCOUNTER — Telehealth: Payer: Self-pay

## 2021-01-18 NOTE — Telephone Encounter (Signed)
TC from [facility] to verify patient address and gestational age. Per pharmacy, 17-P to arrive at [facility] on 01/24/2021. Patient scheduled for 1st 17-P on 01/28/2021. Patient has questions about the medicine "3" and states she couldn't find it at the pharmacy. Patient counseled she should look for Aspirin, 81 mg and counseled to ask the pharmacist for help if she can't find it. Patient states understanding. Interpreter, M. [place].Burt Knack, RN

## 2021-01-23 ENCOUNTER — Encounter: Payer: Self-pay | Admitting: Advanced Practice Midwife

## 2021-01-28 ENCOUNTER — Telehealth: Payer: Self-pay

## 2021-01-28 ENCOUNTER — Other Ambulatory Visit: Payer: Self-pay

## 2021-01-28 NOTE — Telephone Encounter (Signed)
[facility] appt this am for 17P due to emergency per client. Client requested am appt on 02/01/2021 and to arrive at 0900.

## 2021-01-29 ENCOUNTER — Other Ambulatory Visit: Payer: Self-pay

## 2021-01-29 ENCOUNTER — Emergency Department
Admission: EM | Admit: 2021-01-29 | Discharge: 2021-01-29 | Disposition: A | Discharge status: Home / Self Care | Payer: Self-pay | Attending: Emergency Medicine | Admitting: Emergency Medicine

## 2021-01-29 DIAGNOSIS — B9689 Other specified bacterial agents as the cause of diseases classified elsewhere: Secondary | ICD-10-CM | POA: Insufficient documentation

## 2021-01-29 DIAGNOSIS — Z3A16 16 weeks gestation of pregnancy: Secondary | ICD-10-CM | POA: Insufficient documentation

## 2021-01-29 DIAGNOSIS — Z7982 Long term (current) use of aspirin: Secondary | ICD-10-CM | POA: Insufficient documentation

## 2021-01-29 DIAGNOSIS — N3001 Acute cystitis with hematuria: Secondary | ICD-10-CM

## 2021-01-29 DIAGNOSIS — O2312 Infections of bladder in pregnancy, second trimester: Secondary | ICD-10-CM | POA: Insufficient documentation

## 2021-01-29 LAB — URINALYSIS, COMPLETE (UACMP) WITH MICROSCOPIC
Bacteria, UA: NONE SEEN
Bilirubin Urine: NEGATIVE
Glucose, UA: NEGATIVE mg/dL
Ketones, ur: NEGATIVE mg/dL
Nitrite: NEGATIVE
Protein, ur: 100 mg/dL — AB
Specific Gravity, Urine: 1.01 (ref 1.005–1.030)
pH: 7 (ref 5.0–8.0)

## 2021-01-29 LAB — CBC
HCT: 36.2 % (ref 36.0–46.0)
Hemoglobin: 12.3 g/dL (ref 12.0–15.0)
MCH: 28.8 pg (ref 26.0–34.0)
MCHC: 34 g/dL (ref 30.0–36.0)
MCV: 84.8 fL (ref 80.0–100.0)
Platelets: 262 10*3/uL (ref 150–400)
RBC: 4.27 MIL/uL (ref 3.87–5.11)
RDW: 13 % (ref 11.5–15.5)
WBC: 9.1 10*3/uL (ref 4.0–10.5)
nRBC: 0 % (ref 0.0–0.2)

## 2021-01-29 LAB — ABO/RH: ABO/RH(D): O POS

## 2021-01-29 LAB — HCG, QUANTITATIVE, PREGNANCY: hCG, Beta Chain, Quant, S: 37905 m[IU]/mL — ABNORMAL HIGH (ref <5)

## 2021-01-29 MED ORDER — CEPHALEXIN 500 MG PO CAPS: 500.0000 mg | ORAL_CAPSULE | Freq: Two times a day (BID) | ORAL | 0 refills | Status: DC

## 2021-01-29 NOTE — ED Notes (Signed)
NAD noted at time of D/C. Pt denies questions or concerns. Pt ambulatory to the lobby at this time.   Loyda, interpreter at bedside to review D/C instructions.

## 2021-01-29 NOTE — ED Triage Notes (Signed)
Pt comes with c/o vaginal bleeding that started this am. Pt state she went to pee and urinated blood. Pt states some cramping. Pt states she is [redacted] weeks pregnant.  Pt states 4 pregnancy.

## 2021-01-29 NOTE — ED Provider Notes (Signed)
[facility] Emergency Department Provider Note   ____________________________________________    I have reviewed the triage vital signs and the nursing notes.   HISTORY  Chief Complaint Vaginal Bleeding     HPI Sandra Maynard is a 35 y.o. female who reports she is [redacted] weeks pregnant.  She reports she urinated this morning and noticed some blood in her urine.  She is adamant that she has no vaginal bleeding and that the blood was only in her urine.  She describes some suprapubic discomfort as well.  No fevers chills.  No back pain, no flank pain.  No nausea or vomiting.  No significant dysuria, positive urinary frequency  Past Medical History:  Diagnosis Date  . Anemia    2012  . 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 Migraine     Patient Active Problem List   Diagnosis Date Noted  . Proteinuria affecting pregnancy 12/11/20 spot protein/creat ratio=2,485 12/14/2020  . Abnormal 1 hour glucola on 12/11/20=136 12/13/2020  . Obesity affecting pregnancy BMI=35.6 12/11/2020  . Supervision of high risk pregnancy in second trimester 12/11/2020  . Late prenatal care 14 3/7 12/11/2020  . History of preterm delivery 12/01/13 34 wks & 02/07/15 at 36 12/11/2020  . Low birth weight infant 5 lbs 02/07/15 12/11/2020  . Hx macrosomic infant 9 lbs 06/17/11 12/11/2020  . 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 Hemorrhage after delivery of fetus on 06/17/11 500 cc 12/11/2020  . 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 History of cesarean section (flat/narrow AP pelvis per Dr. Logan Bores) 08/10/2014    Past Surgical History:  Procedure Laterality Date  . CESAREAN SECTION  2012    Prior to Admission medications   Medication Sig Start Date End Date Taking? Authorizing Provider  cephALEXin (KEFLEX) 500 MG capsule Take 1 capsule (500 mg total) by mouth 2 (two) times  daily. 01/29/21  Yes Jene Every, MD  aspirin 81 MG chewable tablet Chew 81 mg by mouth daily.    [provider]  ondansetron (ZOFRAN ODT) 4 MG disintegrating tablet Take 1 tablet (4 mg total) by mouth every 8 (eight) hours as needed for nausea or vomiting. 12/20/20   Tommi Rumps, PA-C  Prenatal Vit-Fe Fumarate-FA (PRENATAL VITAMINS) 28-0.8 MG TABS Take 28 mg by mouth daily. 11/21/20 02/19/21  Matt Holmes, PA     Allergies Patient has no known allergies.  Family History  Problem Relation Age of Onset  . Hypertension Mother     Social History Social History   Tobacco Use  . Smoking status: Never Smoker  . Smokeless tobacco: Never Used  . Tobacco comment: Denies secondhand smoke exposure  Vaping Use  . Vaping Use: Never used  Substance Use Topics  . Alcohol use: Not Currently  . Drug use: Never    Review of Systems  Constitutional: No fever/chills     Gastrointestinal: No abdominal pain.  No nausea, no vomiting.   Genitourinary: As above Musculoskeletal: Negative for back pain. Skin: Negative for rash.     ____________________________________________   PHYSICAL EXAM:  VITAL SIGNS: ED Triage Vitals  Enc Vitals Group     BP 01/29/21 1029 (!) 138/91     Pulse Rate 01/29/21 1029 (!) 102     Resp 01/29/21 1029 18     Temp 01/29/21 1029 98.5 F (36.9 C)     Temp Source  01/29/21 1029 Oral     SpO2 01/29/21 1029 96 %     Weight --      Height --      Head Circumference --      Peak Flow --      Pain Score 01/29/21 1027 7     Pain Loc --      Pain Edu? --      Excl. in GC? --     Constitutional: Alert and oriented. No acute distress. Pleasant and interactive Eyes: Conjunctivae are normal.  Head: Atraumatic. Nose: No congestion/rhinnorhea. Mouth/Throat: Mucous membranes are moist.   Cardiovascular: Normal rate, regular rhythm.  Respiratory: Normal respiratory effort.  No retractions. Abdomen: Gravid appearance, mild suprapubic  discomfort, no CVA tenderness Genitourinary: deferred  Neurologic:  Normal speech and language. No gross focal neurologic deficits are appreciated.   Skin:  Skin is warm, dry and intact. No rash noted.   ____________________________________________   LABS (all labs ordered are listed, but only abnormal results are displayed)  Labs Reviewed  HCG, QUANTITATIVE, PREGNANCY - Abnormal; Notable for the following components:      Result Value   hCG, Beta Chain, Quant, S 37,905 (*)    All other components within normal limits  URINALYSIS, COMPLETE (UACMP) WITH MICROSCOPIC - Abnormal; Notable for the following components:   Color, Urine YELLOW (*)    APPearance HAZY (*)    Hgb urine dipstick LARGE (*)    Protein, ur 100 (*)    Leukocytes,Ua LARGE (*)    All other components within normal limits  CBC  ABO/RH   ____________________________________________  EKG   ____________________________________________  RADIOLOGY   ____________________________________________   PROCEDURES  Procedure(s) performed: No  Procedures   Critical Care performed: No ____________________________________________   INITIAL IMPRESSION / ASSESSMENT AND PLAN / ED COURSE  Pertinent labs & imaging results that were available during my care of the patient were reviewed by me and considered in my medical decision making (see chart for details).  Patient with urinary frequency, hematuria noted, no flank pain or back pain to suggest kidney stone.  Lab work is reassuring, normal white blood cell count, urinalysis consistent with likely UTI, will cover with Keflex, no vaginal bleeding, outpatient follow-up with gynecology   ____________________________________________   FINAL CLINICAL IMPRESSION(S) / ED DIAGNOSES  Final diagnoses:  Acute cystitis with hematuria      NEW MEDICATIONS STARTED DURING THIS VISIT:  Discharge Medication List as of 01/29/2021 11:38 AM    START taking these  medications   Details  cephALEXin (KEFLEX) 500 MG capsule Take 1 capsule (500 mg total) by mouth 2 (two) times daily., Starting Tue 01/29/2021, Normal         Note:  This document was prepared using Dragon voice recognition software and may include unintentional dictation errors.   Jene Every, MD 01/29/21 1155

## 2021-02-01 ENCOUNTER — Other Ambulatory Visit: Payer: Self-pay

## 2021-02-01 ENCOUNTER — Other Ambulatory Visit: Payer: Self-pay | Admitting: Family Medicine

## 2021-02-01 DIAGNOSIS — Z8751 Personal history of pre-term labor: Secondary | ICD-10-CM

## 2021-02-01 MED ORDER — HYDROXYPROGESTERONE CAPROATE 275 MG/1.1ML ~~LOC~~ SOAJ
275.0000 mg | SUBCUTANEOUS | Status: DC
  Administered 2021-02-01 – 2021-05-23 (×17): 275 mg via SUBCUTANEOUS

## 2021-02-01 NOTE — Progress Notes (Signed)
First 17P given today L arm per order by Elveria Rising, FNP dated 02/01/2021. Tolerated well. To clerk to schedule next 17P for 02/08/2021. Has [facility] appt 02/05/2021. V. Olmedo, interpreter. Jerel Shepherd, RN

## 2021-02-01 NOTE — Progress Notes (Signed)
Order for 17 P    Give injection IM weekly until 36 weeks.   Wendi Snipes, FNP

## 2021-02-05 ENCOUNTER — Ambulatory Visit: Payer: Self-pay | Admitting: Family Medicine

## 2021-02-05 ENCOUNTER — Other Ambulatory Visit: Payer: Self-pay

## 2021-02-05 ENCOUNTER — Ambulatory Visit: Payer: Self-pay

## 2021-02-05 VITALS — BP 131/83 | HR 99 | Temp 97.7°F | Wt 175.2 lb

## 2021-02-05 DIAGNOSIS — O0992 Supervision of high risk pregnancy, unspecified, second trimester: Secondary | ICD-10-CM

## 2021-02-05 LAB — URINALYSIS
Bilirubin, UA: POSITIVE — AB
Glucose, UA: NEGATIVE
Nitrite, UA: NEGATIVE
Specific Gravity, UA: 1.03 (ref 1.005–1.030)
Urobilinogen, Ur: 4 mg/dL — ABNORMAL HIGH (ref 0.2–1.0)
pH, UA: 6 (ref 5.0–7.5)

## 2021-02-05 NOTE — Progress Notes (Signed)
Here today for 17.6 week MH RV. Taking PNV QD is not taking QD ASA due to can't remember what to pick up at the pharmacy. Info given for ASA. Did go the ED 01/29/2021 for abdominal pain and was diagnosed with Acute Cystitis. Continues to take prescribed antibiotics. Repeat Quad screen today due to drawn too early. Needs UA today. Tawny Hopping, RN

## 2021-02-05 NOTE — Progress Notes (Signed)
   PRENATAL VISIT NOTE  Subjective:  Sandra Maynard is a 35 y.o. [phone] at [redacted]w[redacted]d being seen today for ongoing prenatal care.  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  Patient reports no complaints.  Contractions: Not present. Vag. Bleeding: None.  Movement: Present. Denies leaking of fluid/ROM.   The following portions of the patient's history were reviewed and updated as appropriate: allergies, current medications, past family history, past medical history, past social history, past surgical history and problem list. Problem list updated.  Objective:   Vitals:   02/05/21 0836  BP: 131/83  Pulse: 99  Temp: 97.7 F (36.5 C)  Weight: 175 lb 3.2 oz (79.5 kg)    Fetal Status: Fetal Heart Rate (bpm): 160 Fundal Height: 18 cm Movement: Present     General:  Alert, oriented and cooperative. Patient is in no acute distress.  Skin: Skin is warm and dry. No rash noted.   Cardiovascular: Normal heart rate noted  Respiratory: Normal respiratory effort, no problems with respiration noted  Abdomen: Soft, gravid, appropriate for gestational age.  Pain/Pressure: Absent     Pelvic: Cervical exam deferred        Extremities: Normal range of motion.  Edema: None  Mental Status: Normal mood and affect. Normal behavior.  Normal judgment and thought content.   Assessment and Plan:  Pregnancy: [id number] at [redacted]w[redacted]d  1. Supervision of high risk pregnancy in second trimester -Anatomy US scheduled for 5/10  -Quad- recollected today, was previously collected  too soon  -Not taking aspirin- reports that she did not know what ASA to get, gave information sheet with pictures of asa to take to pharmacy.   -patient taking Keflex for acute cystitis and reports no problem with medication or adherence.   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    Preterm labor symptoms and general obstetric precautions including but not limited to vaginal bleeding, contractions, leaking of fluid and fetal movement were reviewed in detail with the patient. Please refer to After Visit Summary for other counseling recommendations.  No follow-ups on file.  Future Appointments  Date Time Provider Department Center  02/08/2021  2:00 PM [facility] PROVIDER [facility] None  02/19/2021  9:00 AM [facility]   M. Yemen used for [place] interpretation.     Wendi Snipes, FNP

## 2021-02-06 NOTE — Progress Notes (Signed)
Urine results reviewed by provider Elveria Rising, FNP. No new orders given. Tawny Hopping, RN

## 2021-02-08 ENCOUNTER — Ambulatory Visit: Payer: Self-pay | Admitting: Advanced Practice Midwife

## 2021-02-08 VITALS — BP 117/82 | HR 108 | Temp 97.7°F | Wt 175.6 lb

## 2021-02-08 DIAGNOSIS — Z789 Other specified health status: Secondary | ICD-10-CM

## 2021-02-08 DIAGNOSIS — O0992 Supervision of high risk pregnancy, unspecified, second trimester: Secondary | ICD-10-CM

## 2021-02-08 DIAGNOSIS — O2340 Unspecified infection of urinary tract in pregnancy, unspecified trimester: Secondary | ICD-10-CM

## 2021-02-08 DIAGNOSIS — Z8751 Personal history of pre-term labor: Secondary | ICD-10-CM

## 2021-02-08 DIAGNOSIS — O99210 Obesity complicating pregnancy, unspecified trimester: Secondary | ICD-10-CM

## 2021-02-08 NOTE — Progress Notes (Signed)
PRENATAL VISIT NOTE  Subjective:  Sandra Maynard is a 35 y.o. [phone] at [redacted]w[redacted]d being seen today for ongoing prenatal care.  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  Patient reports no complaints.  Contractions: Not present. Vag. Bleeding: None.  Movement: Present. Denies leaking of fluid/ROM.   The following portions of the patient's history were reviewed and updated as appropriate: allergies, current medications, past family history, past medical history, past social history, past surgical history and problem list. Problem list updated.  Objective:   Vitals:   02/08/21 1413  BP: 117/82  Pulse: (!) 108  Temp: 97.7 F (36.5 C)  Weight: 175 lb 9.6 oz (79.7 kg)    Fetal Status: Fetal Heart Rate (bpm): 130   Movement: Present     General:  Alert, oriented and cooperative. Patient is in no acute distress.  Skin: Skin is warm and dry. No rash noted.   Cardiovascular: Normal heart rate noted  Respiratory: Normal respiratory effort, no problems with respiration noted  Abdomen: Soft, gravid, appropriate for gestational age.  Pain/Pressure: Absent     Pelvic: Cervical exam deferred        Extremities: Normal range of motion.   Edema: None  Mental Status: Normal mood and affect. Normal behavior. Normal judgment and thought content.   Assessment and Plan:  Pregnancy: [id number] at [redacted]w[redacted]d  1. Urinary tract infection in mother during pregnancy, antepartum Went to [facility] ER with bleeding on 01/29/21 and dx'd with UTI and given Keflex 500 mg BID --pt noncompliant and did not even take daily C&S TOC today - Urine Culture & Sensitivity  2. History of preterm delivery 12/01/13 34 wks & 02/07/15 at 36 Getting 17-P (last dose 02/01/21) weekly  3. Supervision of high risk pregnancy in second trimester Here for 17-P only. Had RV apt 02/05/21. Discussed that Keflex was prescribed BID and potential risks of UTI in pregnancy and importance of compliance Quad screen done  Neg (3rd sample) on 02/05/21 Next RV 03/08/21 Pt reminded of 02/21/21 nephrology apt at [facility]  4. Obesity affecting pregnancy, antepartum 15 lb 9.6 oz (7.076 kg) Taking ASA 81 mg daily  5. Poor historian Pt states was told by ER MD to take Keflex once a day   Preterm labor symptoms and general obstetric precautions including but not limited to vaginal bleeding, contractions, leaking of fluid and fetal movement were reviewed in detail with the patient. Please refer to After Visit Summary for other counseling recommendations.  No follow-ups on file.  Future Appointments  Date Time Provider Department Center  02/15/2021 10:10 AM [facility] NURSE [facility] None  02/19/2021  9:00 AM [facility]  02/22/2021  9:10 AM [facility] NURSE AC-[facility] None  03/01/2021  9:10 AM [facility] NURSE [facility] None  03/08/2021  8:20 AM [facility] PROVIDER AC-MAT None  Alberteen Spindle, CNM

## 2021-02-08 NOTE — Progress Notes (Signed)
17-P given, right arm, tolerated well. Patient scheduled for next 3 17-P appointments and has 03/08/2021 MH RV.Marland KitchenBurt Knack, RN

## 2021-02-08 NOTE — Progress Notes (Signed)
Patient here today for 17-P. Had RV on 02/05/2021, with urine dip 3+ protein. Patient prefers Friday morning appointments.Burt Knack, RN

## 2021-02-11 LAB — URINE CULTURE

## 2021-02-15 ENCOUNTER — Other Ambulatory Visit: Payer: Self-pay

## 2021-02-15 DIAGNOSIS — Z8751 Personal history of pre-term labor: Secondary | ICD-10-CM

## 2021-02-15 NOTE — Progress Notes (Signed)
In Nurse Clinic for 17P. Voices no concerns. Declined interpreter services today. 17 P (Makena) admin today per order by Elveria Rising, FNP dated 02/01/2021 without difficulty L arm. Pt aware of next 17P appt (02/22/2021). Jerel Shepherd, RN

## 2021-02-18 ENCOUNTER — Other Ambulatory Visit: Payer: Self-pay | Admitting: Advanced Practice Midwife

## 2021-02-18 DIAGNOSIS — Z98891 History of uterine scar from previous surgery: Secondary | ICD-10-CM

## 2021-02-18 DIAGNOSIS — O99212 Obesity complicating pregnancy, second trimester: Secondary | ICD-10-CM

## 2021-02-18 DIAGNOSIS — O0932 Supervision of pregnancy with insufficient antenatal care, second trimester: Secondary | ICD-10-CM

## 2021-02-19 ENCOUNTER — Ambulatory Visit: Payer: Self-pay | Attending: Maternal & Fetal Medicine

## 2021-02-19 ENCOUNTER — Other Ambulatory Visit: Payer: Self-pay

## 2021-02-19 DIAGNOSIS — Z3A19 19 weeks gestation of pregnancy: Secondary | ICD-10-CM | POA: Insufficient documentation

## 2021-02-19 DIAGNOSIS — E669 Obesity, unspecified: Secondary | ICD-10-CM | POA: Insufficient documentation

## 2021-02-19 DIAGNOSIS — O0932 Supervision of pregnancy with insufficient antenatal care, second trimester: Secondary | ICD-10-CM | POA: Insufficient documentation

## 2021-02-19 DIAGNOSIS — O99212 Obesity complicating pregnancy, second trimester: Secondary | ICD-10-CM | POA: Insufficient documentation

## 2021-02-19 DIAGNOSIS — Z98891 History of uterine scar from previous surgery: Secondary | ICD-10-CM | POA: Insufficient documentation

## 2021-02-20 ENCOUNTER — Encounter: Payer: Self-pay | Admitting: Advanced Practice Midwife

## 2021-02-22 ENCOUNTER — Other Ambulatory Visit: Payer: Self-pay

## 2021-02-22 DIAGNOSIS — Z8751 Personal history of pre-term labor: Secondary | ICD-10-CM

## 2021-02-22 NOTE — Progress Notes (Signed)
In Nurse clinic for 17 P. Voices no concerns today and denies signs of preterm labor. Administered 17 P (Makena) R arm today without difficulty. Order by Elveria Rising, FNP dated 02/01/2021. To return for next 17P on 03/01/2021, pt aware of appt. Jerel Shepherd, RN

## 2021-03-01 ENCOUNTER — Other Ambulatory Visit: Payer: Self-pay

## 2021-03-01 DIAGNOSIS — Z8751 Personal history of pre-term labor: Secondary | ICD-10-CM

## 2021-03-01 NOTE — Progress Notes (Signed)
17-P injection administered without difficulty per written order of Elveria Rising FNP-BC. Client without complaint of PTL symptoms. Aware of next [facility] RV appt on 03/08/2021. Client with questions regarding [facility] bill and referred to Finance this am. .[employer] ID # [id number] interpreted during appt. Jossie Ng, RN

## 2021-03-06 NOTE — Addendum Note (Signed)
Addended by: Heywood Bene on: 03/06/2021 03:19 PM   Modules accepted: Orders

## 2021-03-08 ENCOUNTER — Other Ambulatory Visit: Payer: Self-pay

## 2021-03-08 ENCOUNTER — Ambulatory Visit: Payer: Self-pay | Admitting: Physician Assistant

## 2021-03-08 VITALS — BP 119/83 | HR 103 | Temp 97.4°F | Wt 180.2 lb

## 2021-03-08 DIAGNOSIS — O1212 Gestational proteinuria, second trimester: Secondary | ICD-10-CM

## 2021-03-08 DIAGNOSIS — O1210 Gestational proteinuria, unspecified trimester: Secondary | ICD-10-CM

## 2021-03-08 DIAGNOSIS — Z8751 Personal history of pre-term labor: Secondary | ICD-10-CM

## 2021-03-08 DIAGNOSIS — O0992 Supervision of high risk pregnancy, unspecified, second trimester: Secondary | ICD-10-CM

## 2021-03-08 LAB — URINALYSIS
Bilirubin, UA: NEGATIVE
Glucose, UA: NEGATIVE
Ketones, UA: NEGATIVE
Nitrite, UA: NEGATIVE
Specific Gravity, UA: 1.025 (ref 1.005–1.030)
Urobilinogen, Ur: 2 mg/dL — ABNORMAL HIGH (ref 0.2–1.0)
pH, UA: 7 (ref 5.0–7.5)

## 2021-03-08 NOTE — Progress Notes (Addendum)
Tolerated 17P injection without complaint and aware to schedule 17P appt next Friday in Nurse Clinic Jossie Ng, RN Urine dip reviewed by A. Streilein PA-C. Jossie Ng, RN

## 2021-03-08 NOTE — Progress Notes (Signed)
   PRENATAL VISIT NOTE  Subjective:  Sandra Maynard is a 35 y.o. [phone] at [redacted]w[redacted]d being seen today for ongoing prenatal care.  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  Patient reports no complaints.  Contractions: Not present. Vag. Bleeding: None.  Movement: Present. Denies leaking of fluid/ROM.   The following portions of the patient's history were reviewed and updated as appropriate: allergies, current medications, past family history, past medical history, past social history, past surgical history and problem list. Problem list updated.  Objective:   Vitals:   03/08/21 0827  BP: 119/83  Pulse: (!) 103  Temp: (!) 97.4 F (36.3 C)  Weight: 180 lb 3.2 oz (81.7 kg)    Fetal Status: Fetal Heart Rate (bpm): 136 Fundal Height: 24 cm Movement: Present     General:  Alert, oriented and cooperative. Patient is in no acute distress.  Skin: Skin is warm and dry. No rash noted.   Cardiovascular: Normal heart rate noted  Respiratory: Normal respiratory effort, no problems with respiration noted  Abdomen: Soft, gravid, appropriate for gestational age.  Pain/Pressure: Absent     Pelvic: Cervical exam deferred        Extremities:  Normal range of motion.  Edema: None  Mental Status: Normal mood and affect. Normal behavior. Normal judgment and thought content.   Assessment and Plan:  Pregnancy: [id number] at [redacted]w[redacted]d  1. Supervision of high risk pregnancy in second trimester PHQ-9 score = 1 today.  2. Gestational proteinuria in second trimester To keep f/u appt with MFM as sched. Continue low dose aspirin. CCUA pro 2+ today. - Urinalysis (Urine Dip)  3. History of preterm delivery 12/01/13 34 wks & 02/07/15 at 36 Continue 17-P.  Preterm labor symptoms and general obstetric precautions including but not limited to vaginal bleeding, contractions, leaking of fluid and fetal movement were reviewed in detail with the patient. Please refer to After Visit Summary for other counseling recommendations.  Return in about 1 week (around 03/15/2021) for 1 week 17-P, 4 weeks routine prenatal care.  Future Appointments  Date Time Provider Department Center  05/21/2021  9:00 AM [facility]    Landry Dyke, PA-C

## 2021-03-15 ENCOUNTER — Other Ambulatory Visit: Payer: Self-pay

## 2021-03-15 DIAGNOSIS — Z8751 Personal history of pre-term labor: Secondary | ICD-10-CM

## 2021-03-15 NOTE — Progress Notes (Signed)
In Nurse Clinic for 17 P. Denies signs of preterm labor. 17 P (Makena autoinjector) administered today L arm, ordered by Elveria Rising, FNP dated 02/01/2021. Tolerated well. M. [place], interpreter. Sent to clerk to schedule next 17 P, due in one week  (03/22/2021.) Jerel Shepherd, RN

## 2021-03-22 ENCOUNTER — Other Ambulatory Visit: Payer: Self-pay

## 2021-03-22 DIAGNOSIS — Z8751 Personal history of pre-term labor: Secondary | ICD-10-CM

## 2021-03-22 NOTE — Progress Notes (Signed)
In Nurse Clinic, 17 P [facility]) administered today per order by Elveria Rising dated 02/01/2021. Tolerated well R arm. Denies preterm labor signs. Pt sent to clerk to schedule next 17 P, due 03/29/2021. Next MH RV 04/05/2021. Sunny Schlein, interpreter today. Jerel Shepherd, RN

## 2021-03-29 ENCOUNTER — Other Ambulatory Visit: Payer: Self-pay

## 2021-03-29 DIAGNOSIS — O0992 Supervision of high risk pregnancy, unspecified, second trimester: Secondary | ICD-10-CM

## 2021-03-29 DIAGNOSIS — Z8751 Personal history of pre-term labor: Secondary | ICD-10-CM

## 2021-03-29 NOTE — Progress Notes (Addendum)
In Nurse Clinic for 17 P. Denies symptoms of preterm labor. 17 P (Makena) administered today per order Elveria Rising, FNP dated 02/01/2021. Tolerated well L arm. Has MHC RV and 17 P appt for 04/05/2021, arrival 845 am. Pt has reminder. M. [place], Interpreter.  Jerel Shepherd, RN

## 2021-04-05 ENCOUNTER — Ambulatory Visit: Payer: Self-pay | Admitting: Family Medicine

## 2021-04-05 ENCOUNTER — Other Ambulatory Visit: Payer: Self-pay

## 2021-04-05 VITALS — BP 111/74 | HR 95 | Temp 97.3°F | Wt 183.0 lb

## 2021-04-05 DIAGNOSIS — Z8751 Personal history of pre-term labor: Secondary | ICD-10-CM

## 2021-04-05 DIAGNOSIS — O0992 Supervision of high risk pregnancy, unspecified, second trimester: Secondary | ICD-10-CM

## 2021-04-05 DIAGNOSIS — O1210 Gestational proteinuria, unspecified trimester: Secondary | ICD-10-CM

## 2021-04-05 LAB — URINALYSIS
Bilirubin, UA: NEGATIVE
Glucose, UA: NEGATIVE
Ketones, UA: NEGATIVE
Nitrite, UA: NEGATIVE
Specific Gravity, UA: 1.025 (ref 1.005–1.030)
Urobilinogen, Ur: 2 mg/dL — ABNORMAL HIGH (ref 0.2–1.0)
pH, UA: 7 (ref 5.0–7.5)

## 2021-04-05 NOTE — Progress Notes (Signed)
24 hour urine instructions given to patient and all questions answered. Patient to start 24 hour collection Sunday (04/05/21) morning and end Monday morning. Patient instructed to return Monday morning for lab to drop off urine.    Floy Sabina, RN

## 2021-04-05 NOTE — Progress Notes (Signed)
   PRENATAL VISIT NOTE  Subjective:  Sandra Maynard is a 35 y.o. [phone] at [redacted]w[redacted]d being seen today for ongoing prenatal care.  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  Patient reports no complaints.  Contractions: Not present. Vag. Bleeding: None.  Movement: Present. Denies leaking of fluid/ROM.   The following portions of the patient's history were reviewed and updated as appropriate: allergies, current medications, past family history, past medical history, past social history, past surgical history and problem list. Problem list updated.  Objective:   Vitals:   04/05/21 0901  BP: 125/82  Pulse: (!) 108  Temp: (!) 97.3 F (36.3 C)  Weight: 183 lb (83 kg)    Fetal Status: Fetal Heart Rate (bpm): 137 Fundal Height: 28 cm Movement: Present     General:  Alert, oriented and cooperative. Patient is in no acute distress.  Skin: Skin is warm and dry. No rash noted.   Cardiovascular: Normal heart rate noted  Respiratory: Normal respiratory effort, no problems with respiration noted  Abdomen: Soft, gravid, appropriate for gestational age.  Pain/Pressure: Absent     Pelvic: Cervical exam deferred        Extremities: Normal  range of motion.     Mental Status: Normal mood and affect. Normal behavior. Normal judgment and thought content.   Assessment and Plan:  Pregnancy: [id number] at [redacted]w[redacted]d  1. Proteinuria affecting pregnancy, antepartum - no complaints from patient,  b/p retaken 111/74  original was 125/82 at beginning of appointment  U/a today 2+ protein Spot and 24 Hr Urine pending  - Urinalysis (Urine Dip) - Protein / creatinine ratio, urine  (Spot) - Protein, urine, 24 hour; Future  2. Supervision of high risk pregnancy in second trimester Taking ASA and PNV daily Anticipated guidance- 28 wk labs and change to visits to Q2 weeks.   3. History of preterm delivery 12/01/13 34 wks & 02/07/15 at 36 17 P given today by RN    Preterm labor symptoms and general obstetric precautions including but not limited to vaginal bleeding, contractions, leaking of fluid and fetal movement were reviewed in detail with the patient. Please refer to After Visit Summary for other counseling recommendations.  Return in about 2 weeks (around 04/19/2021) for 28 week labs, routine prenatal care.  Future Appointments  Date Time Provider Department Center  05/21/2021  9:00 AM [facility]   V. Olmedo  used for [place] interpretation.     Wendi Snipes, FNP

## 2021-04-05 NOTE — Progress Notes (Signed)
Tolerated 17P injection given (right arm) without complaint and aware to schedule 17P appt next Friday 04/12/21 in Nurse Clinic   Urine dip reviewed by provider, Elveria Rising, FNP  Floy Sabina, RN

## 2021-04-06 LAB — PROTEIN / CREATININE RATIO, URINE
Creatinine, Urine: 109.5 mg/dL
Protein, Ur: 132.5 mg/dL
Protein/Creat Ratio: 1210 mg/g creat — ABNORMAL HIGH (ref 0–200)

## 2021-04-12 ENCOUNTER — Other Ambulatory Visit: Payer: Self-pay

## 2021-04-12 DIAGNOSIS — Z8751 Personal history of pre-term labor: Secondary | ICD-10-CM

## 2021-04-12 NOTE — Progress Notes (Signed)
In Nurse Clinic for 17P. Voices no concerns and denies signs of preterm labor. 17 P (Makena) administered today per order by Elveria Rising, FNP dated 02/01/2021. Tolerated well L arm. Has appt for [facility] and 17 P on 04/19/2021, arrival 8:20 am. Pt aware. M. [place], interpreter. Jerel Shepherd, RN

## 2021-04-19 ENCOUNTER — Ambulatory Visit: Payer: Self-pay | Admitting: Family Medicine

## 2021-04-19 ENCOUNTER — Other Ambulatory Visit: Payer: Self-pay

## 2021-04-19 VITALS — BP 120/80 | HR 105 | Temp 96.6°F | Wt 183.8 lb

## 2021-04-19 DIAGNOSIS — O1210 Gestational proteinuria, unspecified trimester: Secondary | ICD-10-CM

## 2021-04-19 DIAGNOSIS — O0993 Supervision of high risk pregnancy, unspecified, third trimester: Secondary | ICD-10-CM

## 2021-04-19 DIAGNOSIS — O0992 Supervision of high risk pregnancy, unspecified, second trimester: Secondary | ICD-10-CM

## 2021-04-19 DIAGNOSIS — Z8751 Personal history of pre-term labor: Secondary | ICD-10-CM

## 2021-04-19 LAB — HEMOGLOBIN, FINGERSTICK: Hemoglobin: 12.1 g/dL (ref 11.1–15.9)

## 2021-04-19 NOTE — Progress Notes (Signed)
   PRENATAL VISIT NOTE  Subjective:  Sandra Maynard is a 35 y.o. [phone] at [redacted]w[redacted]d being seen today for ongoing prenatal care.  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  Patient reports no complaints.  Contractions: Not present. Vag. Bleeding: None.  Movement: Present. Denies leaking of fluid/ROM.   The following portions of the patient's history were reviewed and updated as appropriate: allergies, current medications, past family history, past medical history, past social history, past surgical history and problem list. Problem list updated.  Objective:   Vitals:   04/19/21 0852 04/19/21 0853  BP: 125/88 120/80  Pulse: (!) 103 (!) 105  Temp: (!) 96.6 F (35.9 C)   Weight: 183 lb 12.8 oz (83.4 kg)     Fetal Status: Fetal Heart Rate (bpm): 145 Fundal Height: 30 cm Movement: Present     General:  Alert, oriented and cooperative. Patient is in no acute distress.  Skin: Skin is warm and dry. No rash noted.   Cardiovascular: Normal heart rate noted  Respiratory: Normal respiratory effort, no problems with respiration noted  Abdomen: Soft, gravid, appropriate for gestational age.  Pain/Pressure: Absent     Pelvic: Cervical exam deferred         Extremities: Normal range of motion.  Edema: None  Mental Status: Normal mood and affect. Normal behavior. Normal judgment and thought content.   Assessment and Plan:  Pregnancy: [id number] at 110w2d  1. Supervision of high risk pregnancy in second trimester - 28 week labs today.   - Tdap given by RN -Reviewed prenatal visit schedule q2 wks until 36 wks, then q1 wk.  -Discussed option of doula during delivery, pt reports will think about it. .   - Tdap vaccine greater than or equal to 7yo IM - HIV-1/HIV-2 Qualitative RNA - RPR - Glucose, 1 hour gestational - Hemoglobin, fingerstick  2. Proteinuria affecting pregnancy, antepartum Patient did not return urine for 24 hr urine.  Patient reports that she messed up the times and days.  Informed patient that is was for comparisions of previous 24 hr urine.    No plan noted from [facility]. Will consult with Ralene Bathe, MD on next steps for patient.     Preterm labor symptoms and general obstetric precautions including but not limited to vaginal bleeding, contractions, leaking of fluid and fetal movement were reviewed in detail with the patient. Please refer to After Visit Summary for other counseling recommendations.  Return in about 1 week (around 04/26/2021) for 17 p, routine PNV in 2 weeks .  Future Appointments  Date Time Provider Department Center  05/21/2021  9:00 AM [facility]   Language line  used for Spanish interpretation.     Wendi Snipes, FNP

## 2021-04-19 NOTE — Progress Notes (Addendum)
Tolerated 17P injection given (right arm) without complaint and appointment scheduled for 17P appt next Friday 04/26/21 at 0915 during nurse clinic . MH RV visit appointment made for 7/22.   Hgb reviewed WNL.     Floy Sabina, RN

## 2021-04-20 LAB — RPR: RPR Ser Ql: NONREACTIVE

## 2021-04-20 LAB — GLUCOSE, 1 HOUR GESTATIONAL: Gestational Diabetes Screen: 209 mg/dL — ABNORMAL HIGH (ref 65–139)

## 2021-04-21 LAB — HIV-1/HIV-2 QUALITATIVE RNA
HIV-1 RNA, Qualitative: NONREACTIVE
HIV-2 RNA, Qualitative: NONREACTIVE

## 2021-04-22 ENCOUNTER — Telehealth: Payer: Self-pay

## 2021-04-22 NOTE — Telephone Encounter (Signed)
Following consult with Dr. Alvester Morin, client needs 3 hour GTT as 1 hour GTT 04/19/2021 = 209 (initial 3 hr GTT WNL following elevated 1 hr in 12/2020). Call to client with [facility] [employer] ID # [phone] and left message to call regarding glucose test. Number to call provided. Jossie Ng, RN

## 2021-04-22 NOTE — Telephone Encounter (Signed)
When client contacted regarding appt for 3 hour GTT, Sandra Rising FNP-BC requests RN counsel her on importance of keeping all appts during her pregnancy (RV, [place], [facility] especially) to help ensure a positive pregnancy outcome. Jossie Ng, RN

## 2021-04-22 NOTE — Telephone Encounter (Signed)
When client returns call regarding 3 hour GTT appt, please counsel on importance of keeping all scheduled appts irregardless of location (RV, [place], [facility]) during pregnancy. Jossie Ng, RN

## 2021-04-23 NOTE — Telephone Encounter (Signed)
Call to client with [employer] and counseled regarding need for 3 hour GTT. As 17-P appt scheduled for 04/26/2021 at 0930, client requested to do 3 hour GTT same day. Client to arrive at 0800 for 3 hour GTT and 17-P (noted on appt line). Jossie Ng, RN

## 2021-04-26 ENCOUNTER — Other Ambulatory Visit: Payer: Self-pay

## 2021-04-26 DIAGNOSIS — R7309 Other abnormal glucose: Secondary | ICD-10-CM

## 2021-04-26 DIAGNOSIS — Z8751 Personal history of pre-term labor: Secondary | ICD-10-CM

## 2021-04-26 NOTE — Progress Notes (Signed)
In Nurse Clinic today for 17 P and 3 hr GTT. Denies signs of preterm labor. 17 P administered today per order by M.Garner, FNP dated 02/01/2021. Tolerated well L arm. Pt reports npo since 6:30 pm last night (04/25/21). Instructions given for 3 hr GTT today. To notify RN if n/v. Reports understanding of instructions. Next [facility] appt and 17 P scheduled 05/03/2021, pt has reminder. M. [place], interpreter.

## 2021-04-27 LAB — GLUCOSE TOLERANCE TEST, 6 HOUR
Glucose, 1 Hour GTT: 201 mg/dL — ABNORMAL HIGH (ref 65–199)
Glucose, 2 hour: 207 mg/dL — ABNORMAL HIGH (ref 65–139)
Glucose, 3 hour: 112 mg/dL — ABNORMAL HIGH (ref 65–109)
Glucose, GTT - Fasting: 89 mg/dL (ref 65–99)

## 2021-04-29 ENCOUNTER — Other Ambulatory Visit: Payer: Self-pay | Admitting: Advanced Practice Midwife

## 2021-04-29 ENCOUNTER — Encounter: Payer: Self-pay | Admitting: Advanced Practice Midwife

## 2021-04-29 ENCOUNTER — Telehealth: Payer: Self-pay | Admitting: Advanced Practice Midwife

## 2021-04-29 DIAGNOSIS — O24419 Gestational diabetes mellitus in pregnancy, unspecified control: Secondary | ICD-10-CM

## 2021-04-29 HISTORY — DX: Gestational diabetes mellitus in pregnancy, unspecified control: O24.419

## 2021-04-29 MED ORDER — BLOOD GLUCOSE TEST VI STRP
1.0000 [IU] | ORAL_STRIP | Freq: Four times a day (QID) | 12 refills | Status: DC
Start: 2021-04-29 — End: 2021-05-29

## 2021-04-29 MED ORDER — BLOOD GLUCOSE MONITOR KIT: PACK | 0 refills | Status: DC

## 2021-04-29 MED ORDER — LANCETS MISC
1.0000 [IU] | Freq: Four times a day (QID) | 12 refills | Status: DC
Start: 2021-04-29 — End: 2021-05-29

## 2021-04-29 NOTE — Telephone Encounter (Signed)
T/C to pt via [employer] interpreter, to discuss new dx of gestational diabetes with pt from 04/26/21 abnormal 3 hour GTT. Message left to call us back on voicemail.

## 2021-05-01 ENCOUNTER — Telehealth: Payer: Self-pay | Admitting: Advanced Practice Midwife

## 2021-05-01 ENCOUNTER — Telehealth: Payer: Self-pay

## 2021-05-01 NOTE — Telephone Encounter (Signed)
Phone call to [facility] to move patient's 05/14/2021 appt for Diabetes education to a sooner appt per instructions of CNM E. Sciora. No answer, LMTC regarding above request. Tawny Hopping, RN

## 2021-05-01 NOTE — Telephone Encounter (Signed)
T/C again to pt with interpreter [employer]. Informed pt of new dx of gestational diabetes and need for [facility] apt x2. Pt states [facility] called her this morning and she has an apt with them 05/14/21. I told her that the apt needed to be this week and we would have [facility] call her back and reschedule for this week. Informed pt of need to [facility] and take to [facility] apt. Counseled on expectations of weekly apts, checkiing blood sugars 4x/day, and bringing BS log to apts here. Questions answered.

## 2021-05-02 ENCOUNTER — Telehealth: Payer: Self-pay

## 2021-05-02 NOTE — Telephone Encounter (Signed)
Phone call to pt with interpreter Salli Real.  Pt provided [facility] appt info and instructions: appt on 05/08/21 arrive at 8:15, can not bring children, will be at least 2 hours; there will be a charge but payments can be made. Pt expresses understanding.

## 2021-05-03 ENCOUNTER — Other Ambulatory Visit: Payer: Self-pay

## 2021-05-03 ENCOUNTER — Ambulatory Visit: Payer: Self-pay | Admitting: Family Medicine

## 2021-05-03 VITALS — BP 129/89 | HR 105 | Temp 97.5°F | Wt 184.6 lb

## 2021-05-03 DIAGNOSIS — O0993 Supervision of high risk pregnancy, unspecified, third trimester: Secondary | ICD-10-CM

## 2021-05-03 DIAGNOSIS — O1213 Gestational proteinuria, third trimester: Secondary | ICD-10-CM

## 2021-05-03 DIAGNOSIS — O0992 Supervision of high risk pregnancy, unspecified, second trimester: Secondary | ICD-10-CM

## 2021-05-03 DIAGNOSIS — Z8751 Personal history of pre-term labor: Secondary | ICD-10-CM

## 2021-05-03 DIAGNOSIS — O24419 Gestational diabetes mellitus in pregnancy, unspecified control: Secondary | ICD-10-CM

## 2021-05-03 DIAGNOSIS — O1212 Gestational proteinuria, second trimester: Secondary | ICD-10-CM

## 2021-05-03 LAB — URINALYSIS
Bilirubin, UA: NEGATIVE
Glucose, UA: NEGATIVE
Ketones, UA: NEGATIVE
Nitrite, UA: NEGATIVE
Specific Gravity, UA: 1.02 (ref 1.005–1.030)
Urobilinogen, Ur: 1 mg/dL (ref 0.2–1.0)
pH, UA: 7 (ref 5.0–7.5)

## 2021-05-03 NOTE — Progress Notes (Signed)
PRENATAL VISIT NOTE  Subjective:  Sandra Maynard is a 35 y.o. [phone] at [redacted]w[redacted]d being seen today for ongoing prenatal care.  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  Patient reports no complaints.  Contractions: Not present. Vag. Bleeding: None.  Movement: Present. Denies leaking of fluid/ROM.   The following portions of the patient's history were reviewed and updated as appropriate: allergies, current medications, past family history, past medical history, past social history, past surgical history and problem list. Problem list updated.  Objective:   Vitals:   05/03/21 0846  BP: 129/89  Pulse: (!) 105  Temp: (!) 97.5 F (36.4 C)  Weight: 184 lb 9.6 oz (83.7 kg)    Fetal Status: Fetal Heart Rate (bpm): 140 Fundal Height: 33 cm Movement: Present  Presentation: Transverse  General:  Alert, oriented and cooperative. Patient is in no acute distress.  Skin: Skin is warm and dry. No rash noted.   Cardiovascular: Normal heart rate noted  Respiratory: Normal respiratory effort, no problems with respiration noted  Abdomen: Soft, gravid, appropriate for gestational age.  Pain/Pressure: Absent     Pelvic: Cervical exam  deferred        Extremities: Normal range of motion.  Edema: Trace  Mental Status: Normal mood and affect. Normal behavior. Normal judgment and thought content.   Assessment and Plan:  Pregnancy: [id number] at [redacted]w[redacted]d  1. History of preterm delivery Taking asa as directed 17 p given today by RN.   2. Gestational proteinuria in second trimester U/A done, will continue to monitor urine.   - Urinalysis (Urine Dip)  3. Supervision of high risk pregnancy in second trimester Taking PNV as directed.   Pt wanting BTL as PP contraception.  Feeding plan is breast and bottle.     4. Gestational diabetes mellitus (GDM), antepartum, gestational diabetes method of control unspecified -Discussed importance of keeping [facility] appointment schedule. 7/27 @ 8:30.   -Discussed DM supplies.   -Pt reports not understanding what she was told by RX.  Call to RX.  D/t patient not having insurance RX was trying to tell pt that supplies would be OTC.      -Pt given a list of what is needed to pick up from pharmacy.      Preterm labor symptoms and general obstetric precautions including but not limited to vaginal bleeding, contractions, leaking of fluid and fetal movement were reviewed in detail with the patient. Please refer to After Visit Summary for other counseling recommendations.  Return in about 1 week (around 05/10/2021) for routine prenatal care, glucose follow up.  Future Appointments  Date Time Provider Department Center  05/08/2021  8:30 AM [place], Orlean Bradford, RN [facility] None  05/09/2021  9:00 AM [facility] PROVIDER [facility] None  05/21/2021  9:00 AM [facility]  Juliene Pina used for Spanish interpretation.     Wendi Snipes, FNP

## 2021-05-03 NOTE — Progress Notes (Signed)
Patient here for MH RV at 30w 2d.   Patient states she went [facility] [address] yesterday to pick up prescription of blood sugar supplies. Patient states that due to language barrier she was unable to receive supplies.   [facility] and explained situation, per pharmacist they will give her generic supplies since it will be more cost effective giving she has no health insurance.    [facility] and names of supplies written down to patient to give to pharmacist.   Patient aware of [facility] appointment on 05/08/21 at 08:30 and directions printed out. Appointment reminder card given for upcoming [place] appointment as well.   Floy Sabina, RN

## 2021-05-03 NOTE — Progress Notes (Signed)
Tolerated 17P injection given (right arm) without complaint.   Urine dip reviewed during clinic visit with provider Elveria Rising, FNP.  Appointment made for next week for 17P and f/U MH RV following diabetic class.       Floy Sabina, RN

## 2021-05-08 ENCOUNTER — Encounter: Payer: Self-pay | Attending: Advanced Practice Midwife | Admitting: *Deleted

## 2021-05-08 ENCOUNTER — Other Ambulatory Visit: Payer: Self-pay

## 2021-05-08 ENCOUNTER — Encounter: Payer: Self-pay | Admitting: *Deleted

## 2021-05-08 VITALS — BP 120/72 | Ht 60.0 in | Wt 185.5 lb

## 2021-05-08 DIAGNOSIS — O24419 Gestational diabetes mellitus in pregnancy, unspecified control: Secondary | ICD-10-CM | POA: Insufficient documentation

## 2021-05-08 DIAGNOSIS — O24410 Gestational diabetes mellitus in pregnancy, diet controlled: Secondary | ICD-10-CM

## 2021-05-08 NOTE — Patient Instructions (Signed)
Read booklet on Gestational Diabetes Follow Gestational Meal Planning Guidelines Don't skip meals Avoid fruit juices Check blood sugars 4 x day - before breakfast and 2 hrs after every meal and record  Bring blood sugar log to all appointments Walk 20-30 minutes at least 5 x week if permitted by MD

## 2021-05-08 NOTE — Progress Notes (Signed)
Diabetes Self-Management Education  Visit Type: First/Initial  Appt. Start Time: 0820 Appt. End Time: 0945  05/08/2021  Ms. Sandra Maynard, identified by name and date of birth, is a 35 y.o. female with a diagnosis of Diabetes: Gestational Diabetes.   ASSESSMENT  Blood pressure 120/72, height 5' (1.524 m), weight 185 lb 8 oz (84.1 kg), last menstrual period 09/01/2020, estimated date of delivery 07/10/2021 Body mass index is 36.23 kg/m.   Diabetes Self-Management Education - 05/08/21 1046       Visit Information   Visit Type First/Initial      Initial Visit   Diabetes Type Gestational Diabetes    Are you currently following a meal plan? No    Are you taking your medications as prescribed? Yes    Date Diagnosed "last week"      Health Coping   How would you rate your overall health? Good      Psychosocial Assessment   Patient Belief/Attitude about Diabetes Other (comment)   "worried"   Self-care barriers None    Self-management support Doctor's office    Other persons present Interpreter   Fabian November   Patient Concerns Nutrition/Meal planning;Glycemic Control;Healthy Lifestyle    Special Needs Other (comment)   Educational information in Spanish   Preferred Learning Style Other (comment)   talking/discussion   Learning Readiness Ready    How often do you need to have someone help you when you read instructions, pamphlets, or other written materials from your doctor or pharmacy? --   depends if materials are in Spanish   What is the last grade level you completed in school? 6th      Pre-Education Assessment   Patient understands the diabetes disease and treatment process. Needs Instruction    Patient understands incorporating nutritional management into lifestyle. Needs Instruction    Patient undertands incorporating physical activity into lifestyle. Needs Instruction    Patient understands using medications safely. Needs Instruction    Patient understands  monitoring blood glucose, interpreting and using results Needs Instruction    Patient understands prevention, detection, and treatment of acute complications. Needs Instruction    Patient understands prevention, detection, and treatment of chronic complications. Needs Instruction    Patient understands how to develop strategies to address psychosocial issues. Needs Instruction    Patient understands how to develop strategies to promote health/change behavior. Needs Instruction      Complications   Last HgB A1C per patient/outside source 5.4 %   12/11/2020   How often do you check your blood sugar? 0 times/day (not testing)   [facility] and was instructed on use. Blood sugar upon return demonstration was 89 mg/dL at 9:93 am - fasting.   Have you had a dilated eye exam in the past 12 months? Yes    Have you had a dental exam in the past 12 months? Yes    Are you checking your feet? No      Dietary Intake   Breakfast beans, cheese, tortilla; once a week she has biscuit with bacon, egg or chicken    Snack (morning) 2-3 snacks per day - milk, fruit (banana, apple, pineapple, cantaloup, papaya, mango); yogurt    Lunch rice, meat (beef, chicken, pork); salad with lettuce, tomatoes, cuccumbers - rarely eats veggies    Dinner skips    Beverage(s) water, milk, juice      Exercise   Exercise Type Light (walking / raking leaves)    How many days per  week to you exercise? 4    How many minutes per day do you exercise? 15    Total minutes per week of exercise 60      Patient Education   Previous Diabetes Education No    Disease state  Definition of diabetes, type 1 and 2, and the diagnosis of diabetes;Factors that contribute to the development of diabetes    Nutrition management  Role of diet in the treatment of diabetes and the relationship between the three main macronutrients and blood glucose level;Food label reading, portion sizes and measuring food.;Reviewed blood glucose goals  for pre and post meals and how to evaluate the patients' food intake on their blood glucose level.    Physical activity and exercise  Role of exercise on diabetes management, blood pressure control and cardiac health.    Medications Other (comment)   Limited use of oral medications during pregnancy and potential for insulin.   Monitoring Taught/evaluated SMBG meter.;Purpose and frequency of SMBG.;Taught/discussed recording of test results and interpretation of SMBG.;Identified appropriate SMBG and/or A1C goals.    Chronic complications Relationship between chronic complications and blood glucose control    Psychosocial adjustment Identified and addressed patients feelings and concerns about diabetes    Preconception care Pregnancy and GDM  Role of pre-pregnancy blood glucose control on the development of the fetus;Reviewed with patient blood glucose goals with pregnancy;Role of family planning for patients with diabetes      Individualized Goals (developed by patient)   Reducing Risk Other (comment)   improve blood sugars, lead a healthier lifestyle     Outcomes   Expected Outcomes Demonstrated interest in learning. Expect positive outcomes    Program Status Not Completed             Individualized Plan for Diabetes Self-Management Training:   Learning Objective:  Patient will have a greater understanding of diabetes self-management. Patient education plan is to attend individual and/or group sessions per assessed needs and concerns.   Plan:   Patient Instructions  Read booklet on Gestational Diabetes Follow Gestational Meal Planning Guidelines Don't skip meals Avoid fruit juices Check blood sugars 4 x day - before breakfast and 2 hrs after every meal and record  Bring blood sugar log to all appointments Walk 20-30 minutes at least 5 x week if permitted by MD  Expected Outcomes:  Demonstrated interest in learning. Expect positive outcomes  Education material provided:   Gestational Booklet (Spanish) Gestational Meal Planning Guidelines (Spanish) Simple Meal Plan (Spanish) Goals for a Healthy Pregnancy (Spanish)   If problems or questions, patient to contact team via:   Sharion Settler, RN, CCM, CDCES [phone]  Future DSME appointment:  The patient doesn't have insurance and doesn't want to return for an appointment with the dietitian.

## 2021-05-09 ENCOUNTER — Ambulatory Visit: Payer: Self-pay | Admitting: Physician Assistant

## 2021-05-09 ENCOUNTER — Encounter: Payer: Self-pay | Admitting: Physician Assistant

## 2021-05-09 VITALS — BP 128/88 | HR 96 | Temp 97.2°F | Wt 186.0 lb

## 2021-05-09 DIAGNOSIS — O1213 Gestational proteinuria, third trimester: Secondary | ICD-10-CM

## 2021-05-09 DIAGNOSIS — O99210 Obesity complicating pregnancy, unspecified trimester: Secondary | ICD-10-CM

## 2021-05-09 DIAGNOSIS — O0992 Supervision of high risk pregnancy, unspecified, second trimester: Secondary | ICD-10-CM

## 2021-05-09 DIAGNOSIS — O1210 Gestational proteinuria, unspecified trimester: Secondary | ICD-10-CM

## 2021-05-09 DIAGNOSIS — O0993 Supervision of high risk pregnancy, unspecified, third trimester: Secondary | ICD-10-CM

## 2021-05-09 DIAGNOSIS — O99213 Obesity complicating pregnancy, third trimester: Secondary | ICD-10-CM

## 2021-05-09 DIAGNOSIS — Z8751 Personal history of pre-term labor: Secondary | ICD-10-CM

## 2021-05-09 DIAGNOSIS — O24419 Gestational diabetes mellitus in pregnancy, unspecified control: Secondary | ICD-10-CM

## 2021-05-09 LAB — URINALYSIS
Bilirubin, UA: NEGATIVE
Glucose, UA: NEGATIVE
Ketones, UA: NEGATIVE
Nitrite, UA: NEGATIVE
Specific Gravity, UA: 1.03 (ref 1.005–1.030)
Urobilinogen, Ur: 2 mg/dL — ABNORMAL HIGH (ref 0.2–1.0)
pH, UA: 7 (ref 5.0–7.5)

## 2021-05-09 NOTE — Progress Notes (Addendum)
17-P given left arm, tolerated well. Patient scheduled for 1 week 17-P appt, and 2 week RV with 17-P. Urine culture added to orders per A. Streilein, [place]. Needs Kick counts at next RV.Marland KitchenMarland KitchenBurt Knack, RN

## 2021-05-09 NOTE — Progress Notes (Signed)
Patient here for MH RV at 31 1/7. Blood sugar log copied, patient needs urine dip and 17-P today. Patient to [facility] appointment yesterday and is aware of [facility] U/S on 05/21/2021 at 0900.Marland KitchenMarland KitchenBurt Knack, RN

## 2021-05-09 NOTE — Progress Notes (Signed)
PRENATAL VISIT NOTE  Subjective:  Sandra Maynard is a 35 y.o. [phone] at [redacted]w[redacted]d being seen today for ongoing prenatal care.  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  Patient reports hands swelling (not feet).  Contractions: Not present. Vag. Bleeding: None.  Movement: Present. Denies leaking of fluid/ROM.   The following portions of the patient's history were reviewed and updated as appropriate: allergies, current medications, past family history, past medical history, past social history, past surgical history and problem list. Problem list updated.  Objective:   Vitals:   05/09/21 0900  BP: 128/88  Pulse: 96  Temp: (!) 97.2 F (36.2 C)  Weight: 186 lb (84.4 kg)    Fetal Status: Fetal Heart Rate (bpm): 160 Fundal Height: 32 cm Movement: Present     General:  Alert, oriented and cooperative. Patient is in no acute distress.  Skin: Skin is warm and dry. No rash noted.   Cardiovascular: Normal heart rate noted  Respiratory: Normal respiratory effort, no problems with respiration noted  Abdomen: Soft, gravid, appropriate for gestational age.  Pain/Pressure: Absent     Pelvic: Cervical exam deferred        Extremities: Normal range of motion.  Edema:  Mild pitting, slight indentation  Mental Status: Normal mood and affect. Normal behavior. Normal judgment and thought content.   Assessment and Plan:  Pregnancy: [id number] at [redacted]w[redacted]d  1. Supervision of high risk pregnancy in second trimester Bilat hand swelling noted on exam. Continue prenatal vitamins. Enc to drink water in light of very hot weather. UC&S pending due to 2+ blood and 1+LE on CCUA (no UTI sx). - Urinalysis - Urine Culture  2. History of preterm delivery 12/01/13 34 wks & 02/07/15 at 36 Weekly 17-P injection today, return in 1 week for next injection, to continue to 36 wk. Continue to monitor for PTL sx.  3. Gestational diabetes mellitus +(GDM), antepartum, gestational diabetes method of control unspecified Had [facility] diabetic counseling yest and just started home BS monitoring (FBS 89, 98, 2h pp 125 are only readings so far). Reinforced the importance of diet, exercise and home BS monitoring. Will review info at next visit.  4. Proteinuria affecting pregnancy, antepartum 2+ proteinuria on dip today, to keep [facility] fetal US as sched 05/21/21.  5. Obesity affecting pregnancy, antepartum TWG 26 lb, over desired. Continue daily aspirin. Keep fetal growth [place] as sched.   Preterm labor symptoms and general obstetric precautions including but not limited to vaginal bleeding, contractions, leaking of fluid and fetal movement were reviewed in detail with the patient. Due to language barrier, an interpreter was present during the provider portion of the visit with this patient.  Please refer to After Visit Summary for other counseling recommendations.  Return in about 2 weeks (around 05/23/2021) for Routine prenatal care.  Future Appointments  Date Time Provider  Department Center  05/16/2021  8:30 AM [facility] NURSE [facility] None  05/21/2021  9:00 AM [facility]  05/23/2021  8:40 AM [facility] PROVIDER [facility] None    Landry Dyke, PA-C

## 2021-05-11 LAB — URINE CULTURE

## 2021-05-14 ENCOUNTER — Ambulatory Visit: Payer: Self-pay | Admitting: *Deleted

## 2021-05-16 ENCOUNTER — Telehealth: Payer: Self-pay

## 2021-05-16 ENCOUNTER — Other Ambulatory Visit: Payer: Self-pay

## 2021-05-16 NOTE — Telephone Encounter (Signed)
TC to patient who missed her 17-P appointment today because her daughter has covid 64. Patient states her daughter got sick on Monday, 05/13/2021 and tested positive for covid. Patient states she has tested negative. Patient rescheduled from today to 05/17/2021, tomorrow, and counseled to drive to [facility] entrance, wear a mask and RN will come out to give her 17-P. Patient counseled to re-test herself before her next appointment. Patient states understanding.Burt Knack, RN

## 2021-05-17 ENCOUNTER — Ambulatory Visit: Payer: Self-pay | Admitting: Family Medicine

## 2021-05-17 ENCOUNTER — Other Ambulatory Visit: Payer: Self-pay | Admitting: Maternal & Fetal Medicine

## 2021-05-17 ENCOUNTER — Other Ambulatory Visit: Payer: Self-pay

## 2021-05-17 DIAGNOSIS — Z98891 History of uterine scar from previous surgery: Secondary | ICD-10-CM

## 2021-05-17 DIAGNOSIS — O34219 Maternal care for unspecified type scar from previous cesarean delivery: Secondary | ICD-10-CM

## 2021-05-17 DIAGNOSIS — O99019 Anemia complicating pregnancy, unspecified trimester: Secondary | ICD-10-CM

## 2021-05-17 DIAGNOSIS — O0993 Supervision of high risk pregnancy, unspecified, third trimester: Secondary | ICD-10-CM

## 2021-05-17 DIAGNOSIS — Z8751 Personal history of pre-term labor: Secondary | ICD-10-CM

## 2021-05-17 DIAGNOSIS — O24410 Gestational diabetes mellitus in pregnancy, diet controlled: Secondary | ICD-10-CM

## 2021-05-17 DIAGNOSIS — O99213 Obesity complicating pregnancy, third trimester: Secondary | ICD-10-CM

## 2021-05-17 DIAGNOSIS — O09899 Supervision of other high risk pregnancies, unspecified trimester: Secondary | ICD-10-CM

## 2021-05-17 DIAGNOSIS — Z3A32 32 weeks gestation of pregnancy: Secondary | ICD-10-CM

## 2021-05-17 NOTE — Progress Notes (Signed)
Provider did not see this patient.   ° °Beckie Viscardi, FNP ° °

## 2021-05-17 NOTE — Progress Notes (Signed)
Patient here for 17-P today. Patient has daughter with covid 45, symptoms began 05/13/2021, patient tested negative at home 2 days ago, and was told to come for 17-P, to be given outside. 17-P given, right arm, tolerated well, and patient aware of next RV on 05/23/2021 and her [facility] appt on 05/21/2021. Patient counseled to do another covid test on 05/20/2021, before her 05/21/2021 appointment. Patient states understanding.Burt Knack, RN

## 2021-05-21 ENCOUNTER — Other Ambulatory Visit: Payer: Self-pay | Admitting: Obstetrics and Gynecology

## 2021-05-21 ENCOUNTER — Other Ambulatory Visit: Payer: Self-pay

## 2021-05-21 ENCOUNTER — Ambulatory Visit: Payer: Self-pay | Attending: Obstetrics and Gynecology

## 2021-05-21 VITALS — BP 130/93 | HR 81 | Temp 97.9°F | Resp 20 | Wt 190.5 lb

## 2021-05-21 DIAGNOSIS — O99213 Obesity complicating pregnancy, third trimester: Secondary | ICD-10-CM | POA: Insufficient documentation

## 2021-05-21 DIAGNOSIS — O0993 Supervision of high risk pregnancy, unspecified, third trimester: Secondary | ICD-10-CM

## 2021-05-21 DIAGNOSIS — Z3A32 32 weeks gestation of pregnancy: Secondary | ICD-10-CM | POA: Insufficient documentation

## 2021-05-21 DIAGNOSIS — O24410 Gestational diabetes mellitus in pregnancy, diet controlled: Secondary | ICD-10-CM | POA: Insufficient documentation

## 2021-05-21 DIAGNOSIS — O99019 Anemia complicating pregnancy, unspecified trimester: Secondary | ICD-10-CM

## 2021-05-21 DIAGNOSIS — O34219 Maternal care for unspecified type scar from previous cesarean delivery: Secondary | ICD-10-CM

## 2021-05-21 DIAGNOSIS — O09213 Supervision of pregnancy with history of pre-term labor, third trimester: Secondary | ICD-10-CM | POA: Insufficient documentation

## 2021-05-21 DIAGNOSIS — O09899 Supervision of other high risk pregnancies, unspecified trimester: Secondary | ICD-10-CM

## 2021-05-21 DIAGNOSIS — O24419 Gestational diabetes mellitus in pregnancy, unspecified control: Secondary | ICD-10-CM

## 2021-05-21 DIAGNOSIS — Z98891 History of uterine scar from previous surgery: Secondary | ICD-10-CM | POA: Insufficient documentation

## 2021-05-21 DIAGNOSIS — O99013 Anemia complicating pregnancy, third trimester: Secondary | ICD-10-CM | POA: Insufficient documentation

## 2021-05-23 ENCOUNTER — Other Ambulatory Visit: Payer: Self-pay

## 2021-05-23 ENCOUNTER — Ambulatory Visit: Payer: Self-pay | Admitting: Family Medicine

## 2021-05-23 VITALS — BP 153/101 | HR 77 | Temp 97.6°F | Wt 192.6 lb

## 2021-05-23 DIAGNOSIS — Z8751 Personal history of pre-term labor: Secondary | ICD-10-CM

## 2021-05-23 DIAGNOSIS — Z98891 History of uterine scar from previous surgery: Secondary | ICD-10-CM

## 2021-05-23 DIAGNOSIS — O99213 Obesity complicating pregnancy, third trimester: Secondary | ICD-10-CM

## 2021-05-23 DIAGNOSIS — O0992 Supervision of high risk pregnancy, unspecified, second trimester: Secondary | ICD-10-CM

## 2021-05-23 DIAGNOSIS — O133 Gestational [pregnancy-induced] hypertension without significant proteinuria, third trimester: Secondary | ICD-10-CM

## 2021-05-23 DIAGNOSIS — O1213 Gestational proteinuria, third trimester: Secondary | ICD-10-CM

## 2021-05-23 DIAGNOSIS — O0993 Supervision of high risk pregnancy, unspecified, third trimester: Secondary | ICD-10-CM

## 2021-05-23 DIAGNOSIS — O24419 Gestational diabetes mellitus in pregnancy, unspecified control: Secondary | ICD-10-CM

## 2021-05-23 LAB — URINALYSIS
Bilirubin, UA: NEGATIVE
Glucose, UA: NEGATIVE
Ketones, UA: NEGATIVE
Nitrite, UA: NEGATIVE
Specific Gravity, UA: 1.015 (ref 1.005–1.030)
Urobilinogen, Ur: 0.2 mg/dL (ref 0.2–1.0)
pH, UA: 7 (ref 5.0–7.5)

## 2021-05-23 NOTE — Progress Notes (Signed)
[facility] Department  PRENATAL VISIT NOTE  Subjective:  Sandra Maynard is a 35 y.o. [phone] at [redacted]w[redacted]d being seen today for ongoing prenatal care.  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  Patient reports no complaints.  Contractions: Not present. Vag. Bleeding: None.  Movement: Present. Denies leaking of fluid.   The following portions of the patient's history were reviewed and updated as appropriate: allergies, current medications, past family history, past medical history, past social history, past surgical history and problem list.   Objective:   Vitals:   05/23/21 0851  BP: (!) 153/101  Pulse: 77  Temp: 97.6 F (36.4 C)  Weight: 192 lb 9.6 oz (87.4 kg)   Repeat BP 139/96 Fetal Status: Fetal Heart Rate (bpm): 130 Fundal Height: 35 cm Movement: Present  Presentation: Vertex  General:  Alert, oriented and cooperative. Patient is in no acute distress.  Skin: Skin is warm and dry. No rash noted.   Cardiovascular: Normal heart rate noted  Respiratory: Normal respiratory effort, no problems with respiration noted  Abdomen: Soft, gravid, appropriate for gestational age.  Pain/Pressure: Absent     Pelvic: Cervical exam deferred         Extremities: Normal range of motion.  Edema: Mild pitting, slight indentation  Mental Status: Normal mood and affect. Normal behavior. Normal judgment and thought content.   Assessment and Plan:  Pregnancy: [id number] at [redacted]w[redacted]d  1. Supervision of high risk pregnancy in second trimester Up to date currently - Urinalysis  2. Obesity affecting pregnancy in third trimester TWG=32 lb 9.6 oz (14.8 kg) which is well above the goal range for her. Patient has GDM and h.o macrosomia  3. Proteinuria affecting pregnancy in third trimester UA at baseline 24 hr Urine protein= 2180mg  UPC baseline=2485mg  Has seen [facility], had work up, no etiology yet identified. CKD Stage 1  5. Gestational diabetes mellitus (GDM) in third trimester, gestational diabetes method of control unspecified [place] 68-98 (only 1 over 95) 2hr 97-127 (only 1 over 120) Well controlled on diet alone Will need EFW at 36 weeks (referral placed today)  6. History of preterm delivery 12/01/13 34 wks & 02/07/15 at 36 Taking 17p  7. Hx macrosomic infant 9 lbs 06/17/11 Needs EFW at 36 weeks  8. History of cesarean section (flat/narrow AP pelvis per Dr. 08/17/11) Will need 37 week CS (see below)  9. Gestational hypertension, third trimester Patient with multiple risk factors for PEC and now meets criteria for gHTN + baseline proteinuria making dx of preeclampsia more complicated. Patient reports swelling in legs (baseline) and UA today was 2+ (which is baseline).  Patient explicitly denies HA, visions changes, RUQ pain. BP is mild range and we will get labs to determine if there are other risk factors for severe PEC.  Discussed that early preeclampsia is very serious and any change  in how she feels should prompt an immediate visit to the hospital. Reviewed risk of PEC which include seizure, stroke, abruption, stillbirth among others. Voiced understanding.   NST today REACTIVE (130/mod/+accels no decels and toco quiet)  Counseled  patient about the likely scenarios 1) Labs WNL or at baseline, plan for NST 2x weekly until delivery at 37 weeks, 2 times weekly appointments for provider ROS and Weekly labs - CMP, CBC, UPC 2) Labs are abnormal today- will call patient to go directly to the hospital with possibility of admission with delivery at 34 week  Epic message sent to [facility] MD group about client to alert them to need for 37 wk delivery and possibly earlier.   Preterm labor symptoms and general obstetric precautions including but not limited to vaginal bleeding, contractions, leaking of fluid and fetal movement were reviewed in detail with the patient. Please refer to After Visit Summary for other counseling recommendations.   Return in about 4 days (around 05/27/2021) for Routine prenatal care, gestational hypertension.  Future Appointments  Date Time Provider Department Center  05/27/2021  9:40 AM [facility] PROVIDER [facility] None  05/30/2021  8:40 AM [facility] PROVIDER AC[facility]MAT None  06/20/2021  8:00 AM [facility]    Federico Flake, MD

## 2021-05-23 NOTE — Progress Notes (Signed)
Patient here for MH RV at 33 1/7. Urine dip today and BS log copied. 17-P given, left arm, tolerated well.Burt Knack, RN

## 2021-05-23 NOTE — Progress Notes (Signed)
Patient scheduled for MH RV with NST  for 05/27/21 and 05/30/21, per provider instructions. Patient currently having NST.Marland KitchenBurt Knack, RN

## 2021-05-24 ENCOUNTER — Telehealth: Payer: Self-pay | Admitting: Family Medicine

## 2021-05-24 ENCOUNTER — Other Ambulatory Visit: Payer: Self-pay

## 2021-05-24 ENCOUNTER — Encounter: Payer: Self-pay | Admitting: Obstetrics & Gynecology

## 2021-05-24 ENCOUNTER — Inpatient Hospital Stay
Admission: EM | Admit: 2021-05-24 | Discharge: 2021-05-31 | DRG: 784 | Disposition: A | Discharge status: Home / Self Care | Payer: Medicaid Other | Attending: Obstetrics & Gynecology | Admitting: Obstetrics & Gynecology

## 2021-05-24 DIAGNOSIS — O9081 Anemia of the puerperium: Secondary | ICD-10-CM | POA: Diagnosis not present

## 2021-05-24 DIAGNOSIS — Z3A33 33 weeks gestation of pregnancy: Secondary | ICD-10-CM | POA: Diagnosis not present

## 2021-05-24 DIAGNOSIS — D62 Acute posthemorrhagic anemia: Secondary | ICD-10-CM | POA: Diagnosis not present

## 2021-05-24 DIAGNOSIS — O114 Pre-existing hypertension with pre-eclampsia, complicating childbirth: Secondary | ICD-10-CM | POA: Diagnosis present

## 2021-05-24 DIAGNOSIS — O1002 Pre-existing essential hypertension complicating childbirth: Secondary | ICD-10-CM | POA: Diagnosis present

## 2021-05-24 DIAGNOSIS — O1213 Gestational proteinuria, third trimester: Secondary | ICD-10-CM

## 2021-05-24 DIAGNOSIS — Z98891 History of uterine scar from previous surgery: Secondary | ICD-10-CM

## 2021-05-24 DIAGNOSIS — O99214 Obesity complicating childbirth: Secondary | ICD-10-CM | POA: Diagnosis present

## 2021-05-24 DIAGNOSIS — O1490 Unspecified pre-eclampsia, unspecified trimester: Secondary | ICD-10-CM

## 2021-05-24 DIAGNOSIS — Z20822 Contact with and (suspected) exposure to covid-19: Secondary | ICD-10-CM | POA: Diagnosis present

## 2021-05-24 DIAGNOSIS — O24420 Gestational diabetes mellitus in childbirth, diet controlled: Secondary | ICD-10-CM | POA: Diagnosis present

## 2021-05-24 DIAGNOSIS — Z3A34 34 weeks gestation of pregnancy: Secondary | ICD-10-CM

## 2021-05-24 DIAGNOSIS — O34211 Maternal care for low transverse scar from previous cesarean delivery: Secondary | ICD-10-CM | POA: Diagnosis present

## 2021-05-24 DIAGNOSIS — O1493 Unspecified pre-eclampsia, third trimester: Secondary | ICD-10-CM

## 2021-05-24 DIAGNOSIS — O0992 Supervision of high risk pregnancy, unspecified, second trimester: Secondary | ICD-10-CM

## 2021-05-24 DIAGNOSIS — Z302 Encounter for sterilization: Secondary | ICD-10-CM

## 2021-05-24 DIAGNOSIS — O99213 Obesity complicating pregnancy, third trimester: Secondary | ICD-10-CM

## 2021-05-24 DIAGNOSIS — O24419 Gestational diabetes mellitus in pregnancy, unspecified control: Secondary | ICD-10-CM

## 2021-05-24 DIAGNOSIS — R030 Elevated blood-pressure reading, without diagnosis of hypertension: Secondary | ICD-10-CM | POA: Diagnosis present

## 2021-05-24 HISTORY — DX: Unspecified pre-eclampsia, unspecified trimester: O14.90

## 2021-05-24 LAB — PROTEIN / CREATININE RATIO, URINE
Creatinine, Urine: 22.7 mg/dL
Protein, Ur: 117.1 mg/dL
Protein/Creat Ratio: 5159 mg/g creat — ABNORMAL HIGH (ref 0–200)

## 2021-05-24 LAB — TYPE AND SCREEN
ABO/RH(D): O POS
Antibody Screen: NEGATIVE

## 2021-05-24 LAB — CBC
Hematocrit: 37.3 % (ref 34.0–46.6)
Hemoglobin: 11.9 g/dL (ref 11.1–15.9)
MCH: 27.6 pg (ref 26.6–33.0)
MCHC: 31.9 g/dL (ref 31.5–35.7)
MCV: 87 fL (ref 79–97)
Platelets: 220 10*3/uL (ref 150–450)
RBC: 4.31 x10E6/uL (ref 3.77–5.28)
RDW: 12.8 % (ref 11.7–15.4)
WBC: 7.8 10*3/uL (ref 3.4–10.8)

## 2021-05-24 LAB — COMPREHENSIVE METABOLIC PANEL
ALT: 8 IU/L (ref 0–32)
AST: 11 IU/L (ref 0–40)
Albumin/Globulin Ratio: 1.1 — ABNORMAL LOW (ref 1.2–2.2)
Albumin: 2.9 g/dL — ABNORMAL LOW (ref 3.8–4.8)
Alkaline Phosphatase: 118 IU/L (ref 44–121)
BUN/Creatinine Ratio: 13 (ref 9–23)
BUN: 7 mg/dL (ref 6–20)
Bilirubin Total: <0.2 mg/dL (ref 0.0–1.2)
CO2: 20 mmol/L (ref 20–29)
Calcium: 8.7 mg/dL (ref 8.7–10.2)
Chloride: 102 mmol/L (ref 96–106)
Creatinine, Ser: 0.54 mg/dL — ABNORMAL LOW (ref 0.57–1.00)
Globulin, Total: 2.6 g/dL (ref 1.5–4.5)
Glucose: 82 mg/dL (ref 65–99)
Potassium: 4.2 mmol/L (ref 3.5–5.2)
Sodium: 135 mmol/L (ref 134–144)
Total Protein: 5.5 g/dL — ABNORMAL LOW (ref 6.0–8.5)
eGFR: 125 mL/min/{1.73_m2} (ref >59)

## 2021-05-24 LAB — RESP PANEL BY RT-PCR (FLU A&B, COVID) ARPGX2
Influenza A by PCR: NEGATIVE
Influenza B by PCR: NEGATIVE
SARS Coronavirus 2 by RT PCR: NEGATIVE

## 2021-05-24 MED ORDER — LABETALOL HCL 5 MG/ML IV SOLN: 80.0000 mg | INTRAVENOUS | Status: DC | PRN

## 2021-05-24 MED ORDER — SODIUM CHLORIDE 0.9% FLUSH
3.0000 mL | Freq: Two times a day (BID) | INTRAVENOUS | Status: DC
  Administered 2021-05-24 – 2021-05-27 (×6): 3 mL via INTRAVENOUS

## 2021-05-24 MED ORDER — BETAMETHASONE SOD PHOS & ACET 6 (3-3) MG/ML IJ SUSP
12.0000 mg | INTRAMUSCULAR | Status: AC
  Administered 2021-05-24 – 2021-05-25 (×2): 12 mg via INTRAMUSCULAR
  Filled 2021-05-24: qty 5

## 2021-05-24 MED ORDER — CALCIUM CARBONATE ANTACID 500 MG PO CHEW: 2.0000 | CHEWABLE_TABLET | ORAL | Status: DC | PRN

## 2021-05-24 MED ORDER — DOCUSATE SODIUM 100 MG PO CAPS
100.0000 mg | ORAL_CAPSULE | Freq: Every day | ORAL | Status: DC
  Administered 2021-05-25 – 2021-05-28 (×4): 100 mg via ORAL
  Filled 2021-05-24 (×4): qty 1

## 2021-05-24 MED ORDER — ACETAMINOPHEN 325 MG PO TABS: 650.0000 mg | ORAL_TABLET | ORAL | Status: DC | PRN

## 2021-05-24 MED ORDER — SODIUM CHLORIDE 0.9 % IV SOLN: 250.0000 mL | INTRAVENOUS | Status: DC | PRN

## 2021-05-24 MED ORDER — ZOLPIDEM TARTRATE 5 MG PO TABS: 5.0000 mg | ORAL_TABLET | Freq: Every evening | ORAL | Status: DC | PRN

## 2021-05-24 MED ORDER — SODIUM CHLORIDE 0.9% FLUSH: 3.0000 mL | INTRAVENOUS | Status: DC | PRN

## 2021-05-24 MED ORDER — LABETALOL HCL 5 MG/ML IV SOLN: 40.0000 mg | INTRAVENOUS | Status: DC | PRN

## 2021-05-24 MED ORDER — HYDRALAZINE HCL 20 MG/ML IJ SOLN: 10.0000 mg | INTRAMUSCULAR | Status: DC | PRN

## 2021-05-24 MED ORDER — NIFEDIPINE ER OSMOTIC RELEASE 30 MG PO TB24
60.0000 mg | ORAL_TABLET | Freq: Every day | ORAL | Status: DC
  Administered 2021-05-24 – 2021-05-30 (×7): 60 mg via ORAL
  Filled 2021-05-24 (×8): qty 2

## 2021-05-24 MED ORDER — LABETALOL HCL 5 MG/ML IV SOLN: 20.0000 mg | INTRAVENOUS | Status: DC | PRN

## 2021-05-24 MED ORDER — PRENATAL MULTIVITAMIN CH
1.0000 | ORAL_TABLET | Freq: Every day | ORAL | Status: DC
  Administered 2021-05-26 – 2021-05-28 (×3): 1 via ORAL
  Filled 2021-05-24 (×3): qty 1

## 2021-05-24 NOTE — H&P (Signed)
Obstetrics Admission History & Physical   CC: Elevated BP and protinuria  HPI:  35 y.o. [id number] @ [redacted]w[redacted]d(07/10/2021, by Other Basis). Admitted on 05/24/2021:   Patient Active Problem List   Diagnosis Date Noted   Preeclampsia 05/24/2021   Gestational diabetes 04/26/21 04/29/2021   [facility] 02/08/2021   Poor historian 02/08/2021   Proteinuria affecting pregnancy 12/11/20 spot protein/creat ratio=2,485 12/14/2020   Obesity affecting pregnancy BMI=35.6 12/11/2020   Supervision of high risk pregnancy in second trimester 12/11/2020   Late prenatal care 14 3/7 12/11/2020   History of preterm delivery 12/01/13 34 wks & 02/07/15 at 36 12/11/2020   Low birth weight infant 5 lbs 02/07/15 12/11/2020   Hx macrosomic infant 9 lbs 06/17/11 12/11/2020   Depression affecting pregnancy 12/11/2020   Hemorrhage after delivery of fetus on 06/17/11 500 cc 12/11/2020   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 EAmalia Hailey 08/10/2014    Presents for elevated BP and proteinuria. She is seen by [facility] for high risk pregnancy related to GDM and prior CS and baseline proteinuria; recently noted to have escalation in her blood pressures, and then worsening of proteinuria today.  She denies headache, visual changes, edema (mild pedal, no recent changes), CP/SOB, or epigastric pain.  BS have been well controlled.    ROS: A review of systems was performed and negative, except as stated in the above HPI. PMHx:  Past Medical History:  Diagnosis Date   Anemia    2012   Depression affecting pregnancy 12/11/2020   Gestational diabetes 04/26/21 04/29/2021   Migraine    Preeclampsia 05/24/2021   PSHx:  Past Surgical History:  Procedure Laterality Date   CESAREAN SECTION  2012   Medications:  Facility-Administered Medications Prior to Admission  Medication Dose  Route Frequency Provider Last Rate Last Admin   HYDROXYprogesterone caproate (Makena) autoinjector 275 mg  275 mg Subcutaneous Q7 days GJunious Dresser FNP   275 mg at 05/23/21 0945   Medications Prior to Admission  Medication Sig Dispense Refill Last Dose   aspirin 81 MG chewable tablet Chew 81 mg by mouth daily.   05/23/2021   Prenatal Vit-Fe Fumarate-FA (MULTIVITAMIN-PRENATAL) 27-0.8 MG TABS tablet Take 1 tablet by mouth daily at 12 noon.   05/23/2021   blood glucose meter kit and supplies KIT Dispense based on patient and insurance preference. Use up to four times daily as directed. 1 each 0 Unknown   Glucose Blood (BLOOD GLUCOSE TEST STRIPS) STRP 1 Units by In Vitro route in the morning, at noon, in the evening, and at bedtime. 120 strip 12 Unknown   Lancets MISC 1 Units by Does not apply route in the morning, at noon, in the evening, and at bedtime. Dispense insurance approves, Medicaid approved or least expensive lancets 120 each 12 Unknown   Allergies: has No Known Allergies. OBHx:  OB History  Gravida Para Term Preterm AB Living  4 3 1 2   3   SAB IAB Ectopic Multiple Live Births          3    # Outcome Date GA Lbr Len/2nd Weight Sex Delivery Anes PTL Lv  4 Current           3 Preterm 02/07/15 350w0d F VBAC  N LIV  2 Preterm 12/01/13 347w6d438 g F  VBAC   LIV  1 Term 06/17/11 [redacted]w[redacted]d 3430 g F CS-LTranv   LIV    Obstetric Comments  Patient does not remember weight of baby born 02/07/2015.    FLDK:CCQFJUVQ/QUIVHOYWVXUCexcept as detailed in HPI..Marland Kitchen No family history of birth defects. Soc Hx: Alcohol: none and Recreational drug use: none  Objective:   Vitals:   05/24/21 1919 05/24/21 1923  BP: (!) 159/91 (!) 160/94  Pulse: 91 91  Resp:    Temp:    SpO2:     Constitutional: Well nourished, well developed female in no acute distress.  HEENT: normal Skin: Warm and dry.  Cardiovascular:Regular rate and rhythm.   Extremity: trace to 1+ bilateral pedal edema Respiratory:  Clear to auscultation bilateral. Normal respiratory effort Abdomen: gravid, ND, FHT present, mild tenderness on exam Back: no CVAT Neuro: DTRs 2+, Cranial nerves grossly intact. No clonus Psych: Alert and Oriented x3. No memory deficits. Normal mood and affect.  MS: normal gait, normal bilateral lower extremity ROM/strength/stability.  A NST procedure was performed with FHR monitoring and a normal baseline established, appropriate time of 20-40 minutes of evaluation, and accels >2 seen w 15x15 characteristics.  Results show a REACTIVE NST.    Perinatal info:  Blood type: O positive RPR NR / HIV Neg/ HBsAg Neg   Assessment & Plan:   35y.o. [id number]@ 398w2dAdmitted on 05/24/2021:Preeclampsia Severe features with proteinuria changes Monitor BP ranges As may deliver early (and would rec at 34 weeks) will proceed w BMZ BS monitoring Lab sregularly. NST daily BPP q 3 days Procardia now.  Labetalol prn Neo consult Consider MFM as well prior to deliver. 24 hour urine due to h/o proteinuria  PaBarnett ApplebaumMD, FALake Sante[place]lahCoErs[place] 05/24/2021  7:36 PM

## 2021-05-24 NOTE — Telephone Encounter (Signed)
Called patient to discuss lab changes with use of interpreter Marlene Yemen  Abnormal lab- UPC= [phone].   Patient seen yesterday for routine prenatal and found to have elevated BP meeting criteria for gHTN with unknown status of PEC. She did not have sx on 8/11.   I informed the patient about her elevated urine protein that is double her baseline. I recommended expedited assessment at the hospital.   Only 1 car in family-- husband has this car at work and will not be off until [employer], but reports not childcare right now.   She is going to call friends and husband to coordinate childcare and going to the hospital. She thinks she will be able to coordinate this. I recommended she call back the [facility] and ask to speak to me or maternity clinic RN if she feels a taxi would be helpful.   She denied HA, changes in vision, RUQ pain.   CMP and CBC, due to a lab error are not available while writing this note. They have been run stat and should result and flow into Epic within the next hour   I called the team for [facility] to alert them to concern for severe PEC at [redacted]w[redacted]d in the setting of GDM and known baseline proteinuria (~2000mg  in first trimester).   Federico Flake, MD

## 2021-05-25 ENCOUNTER — Encounter: Payer: Self-pay | Admitting: Obstetrics & Gynecology

## 2021-05-25 ENCOUNTER — Inpatient Hospital Stay: Payer: Medicaid Other

## 2021-05-25 DIAGNOSIS — R808 Other proteinuria: Secondary | ICD-10-CM

## 2021-05-25 DIAGNOSIS — O99891 Other specified diseases and conditions complicating pregnancy: Secondary | ICD-10-CM

## 2021-05-25 DIAGNOSIS — O09213 Supervision of pregnancy with history of pre-term labor, third trimester: Secondary | ICD-10-CM

## 2021-05-25 DIAGNOSIS — O1493 Unspecified pre-eclampsia, third trimester: Secondary | ICD-10-CM

## 2021-05-25 DIAGNOSIS — Z3A33 33 weeks gestation of pregnancy: Secondary | ICD-10-CM

## 2021-05-25 LAB — PROTEIN, URINE, 24 HOUR
Collection Interval-UPROT: 24 hours
Protein, 24H Urine: 3430 mg/d — ABNORMAL HIGH (ref 50–100)
Protein, Urine: 361 mg/dL
Urine Total Volume-UPROT: 950 mL

## 2021-05-25 LAB — COMPREHENSIVE METABOLIC PANEL
ALT: 11 U/L (ref 0–44)
ALT: 9 U/L (ref 0–44)
AST: 14 U/L — ABNORMAL LOW (ref 15–41)
AST: 13 U/L — ABNORMAL LOW (ref 15–41)
Albumin: 2.8 g/dL — ABNORMAL LOW (ref 3.5–5.0)
Albumin: 2.6 g/dL — ABNORMAL LOW (ref 3.5–5.0)
Alkaline Phosphatase: 116 U/L (ref 38–126)
Alkaline Phosphatase: 105 U/L (ref 38–126)
Anion gap: 10 (ref 5–15)
Anion gap: 7 (ref 5–15)
BUN: 10 mg/dL (ref 6–20)
BUN: 13 mg/dL (ref 6–20)
CO2: 22 mmol/L (ref 22–32)
CO2: 22 mmol/L (ref 22–32)
Calcium: 9.1 mg/dL (ref 8.9–10.3)
Calcium: 8.6 mg/dL — ABNORMAL LOW (ref 8.9–10.3)
Chloride: 105 mmol/L (ref 98–111)
Chloride: 105 mmol/L (ref 98–111)
Creatinine, Ser: 0.63 mg/dL (ref 0.44–1.00)
Creatinine, Ser: 0.59 mg/dL (ref 0.44–1.00)
GFR, Estimated: >60 mL/min (ref >60)
GFR, Estimated: >60 mL/min (ref >60)
Glucose, Bld: 122 mg/dL — ABNORMAL HIGH (ref 70–99)
Glucose, Bld: 131 mg/dL — ABNORMAL HIGH (ref 70–99)
Potassium: 4.3 mmol/L (ref 3.5–5.1)
Potassium: 3.8 mmol/L (ref 3.5–5.1)
Sodium: 137 mmol/L (ref 135–145)
Sodium: 134 mmol/L — ABNORMAL LOW (ref 135–145)
Total Bilirubin: 0.8 mg/dL (ref 0.3–1.2)
Total Bilirubin: 0.6 mg/dL (ref 0.3–1.2)
Total Protein: 6.8 g/dL (ref 6.5–8.1)
Total Protein: 6.2 g/dL — ABNORMAL LOW (ref 6.5–8.1)

## 2021-05-25 LAB — GLUCOSE, CAPILLARY
Glucose-Capillary: 123 mg/dL — ABNORMAL HIGH (ref 70–99)
Glucose-Capillary: 130 mg/dL — ABNORMAL HIGH (ref 70–99)
Glucose-Capillary: 112 mg/dL — ABNORMAL HIGH (ref 70–99)
Glucose-Capillary: 116 mg/dL — ABNORMAL HIGH (ref 70–99)
Glucose-Capillary: 121 mg/dL — ABNORMAL HIGH (ref 70–99)
Glucose-Capillary: 109 mg/dL — ABNORMAL HIGH (ref 70–99)

## 2021-05-25 LAB — CBC
HCT: 39.9 % (ref 36.0–46.0)
HCT: 36.3 % (ref 36.0–46.0)
Hemoglobin: 13.3 g/dL (ref 12.0–15.0)
Hemoglobin: 11.9 g/dL — ABNORMAL LOW (ref 12.0–15.0)
MCH: 27.5 pg (ref 26.0–34.0)
MCH: 28 pg (ref 26.0–34.0)
MCHC: 33.3 g/dL (ref 30.0–36.0)
MCHC: 32.8 g/dL (ref 30.0–36.0)
MCV: 82.6 fL (ref 80.0–100.0)
MCV: 85.4 fL (ref 80.0–100.0)
Platelets: 263 10*3/uL (ref 150–400)
Platelets: 270 10*3/uL (ref 150–400)
RBC: 4.83 MIL/uL (ref 3.87–5.11)
RBC: 4.25 MIL/uL (ref 3.87–5.11)
RDW: 12.9 % (ref 11.5–15.5)
RDW: 13.1 % (ref 11.5–15.5)
WBC: 8.6 10*3/uL (ref 4.0–10.5)
WBC: 11.6 10*3/uL — ABNORMAL HIGH (ref 4.0–10.5)
nRBC: 0 % (ref 0.0–0.2)
nRBC: 0 % (ref 0.0–0.2)

## 2021-05-25 MED ORDER — INSULIN ASPART 100 UNIT/ML IJ SOLN: 0.0000 [IU] | Freq: Three times a day (TID) | INTRAMUSCULAR | Status: DC

## 2021-05-25 MED ORDER — PRENATAL MULTIVITAMIN CH
ORAL_TABLET | ORAL | Status: AC
  Administered 2021-05-25: 1 via ORAL
  Filled 2021-05-25: qty 1

## 2021-05-25 MED ORDER — INSULIN ASPART 100 UNIT/ML IJ SOLN
0.0000 [IU] | Freq: Three times a day (TID) | INTRAMUSCULAR | Status: DC
  Administered 2021-05-25 – 2021-05-27 (×5): 3 [IU] via SUBCUTANEOUS
  Administered 2021-05-27: 2 [IU] via SUBCUTANEOUS
  Administered 2021-05-28: 3 [IU] via SUBCUTANEOUS
  Administered 2021-05-28: 2 [IU] via SUBCUTANEOUS
  Filled 2021-05-25 (×8): qty 1

## 2021-05-25 NOTE — Progress Notes (Addendum)
Subjective:  Comfortable, no contractions, +FM, no LOF, no VB.  Denies headaches, vision changes, RUQ or epigastric, no edema  Objective:   Vitals: Blood pressure 131/89, pulse 95, temperature 98.2 F (36.8 C), temperature source Oral, resp. rate 18, height 5' (1.524 m), weight 87.4 kg, last menstrual period 09/01/2020, SpO2 97 %. Temp:  [97.8 F (36.6 C)-98.5 F (36.9 C)] 98.4 F (36.9 C) (08/13 1109) Pulse Rate:  [77-102] 102 (08/13 1109) Resp:  [16-18] 18 (08/13 1109) BP: (122-165)/(74-113) 138/80 (08/13 1109) SpO2:  [97 %] 97 % (08/12 1709) Weight:  [87.4 kg] 87.4 kg (08/12 1732)  General: NAD Abdomen: gravid, non-tender Cervical Exam:  Dilation: Closed Exam by:: Harris MD  FHT: 130, moderate, positive accels, on decel noted 7:30 none since [place]: none  Results for orders placed or performed during the hospital encounter of 05/24/21 (from the past 24 hour(s))  Type and screen [facility]     Status: None   Collection Time: 05/24/21  7:57 PM  Result Value Ref Range   ABO/RH(D) O POS    Antibody Screen NEG    Sample Expiration      05/27/2021,2359 Performed at [facility], [address] [place], [place] [address]   Resp Panel by RT-PCR (Flu A&B, Covid) Nasopharyngeal Swab     Status: None   Collection Time: 05/24/21  7:57 PM   Specimen: Nasopharyngeal Swab; Nasopharyngeal(NP) swabs in vial transport medium  Result Value Ref Range   SARS Coronavirus 2 by RT PCR NEGATIVE NEGATIVE   Influenza A by PCR NEGATIVE NEGATIVE   Influenza B by PCR NEGATIVE NEGATIVE  Glucose, capillary     Status: Abnormal   Collection Time: 05/25/21  1:34 AM  Result Value Ref Range   Glucose-Capillary 123 (H) 70 - 99 mg/dL  Glucose, capillary     Status: Abnormal   Collection Time: 05/25/21  5:36 AM  Result Value Ref Range   Glucose-Capillary 130 (H) 70 - 99 mg/dL  Comprehensive metabolic panel     Status: Abnormal   Collection Time: 05/25/21  7:31 AM   Result Value Ref Range   Sodium 137 135 - 145 mmol/L   Potassium 4.3 3.5 - 5.1 mmol/L   Chloride 105 98 - 111 mmol/L   CO2 22 22 - 32 mmol/L   Glucose, Bld 122 (H) 70 - 99 mg/dL   BUN 10 6 - 20 mg/dL   Creatinine, Ser 7.11 0.44 - 1.00 mg/dL   Calcium 9.1 8.9 - 65.7 mg/dL   Total Protein 6.8 6.5 - 8.1 g/dL   Albumin 2.8 (L) 3.5 - 5.0 g/dL   AST 14 (L) 15 - 41 U/L   ALT 11 0 - 44 U/L   Alkaline Phosphatase 116 38 - 126 U/L   Total Bilirubin 0.8 0.3 - 1.2 mg/dL   GFR, Estimated >90 >38 mL/min   Anion gap 10 5 - 15  CBC on admission     Status: None   Collection Time: 05/25/21  7:31 AM  Result Value Ref Range   WBC 8.6 4.0 - 10.5 K/uL   RBC 4.83 3.87 - 5.11 MIL/uL   Hemoglobin 13.3 12.0 - 15.0 g/dL   HCT 33.3 83.2 - 91.9 %   MCV 82.6 80.0 - 100.0 fL   MCH 27.5 26.0 - 34.0 pg   MCHC 33.3 30.0 - 36.0 g/dL   RDW 16.6 06.0 - 04.5 %   Platelets 263 150 - 400 K/uL   nRBC 0.0  0.0 - 0.2 %  Glucose, capillary     Status: Abnormal   Collection Time: 05/25/21  7:49 AM  Result Value Ref Range   Glucose-Capillary 112 (H) 70 - 99 mg/dL    Assessment:   35 y.o. [id number] [redacted]w[redacted]d admitted for elevated BP in setting of worsening chronic proteinuria  Plan:   1) Elevated BP in third trimester in setting of pre-existing but now worsening proteinuria - evaluation of patient prepregnancy blood pressures as well as prior to 20 weeks reveals that she likely suffers from stage II hypertension predating the pregnancy.  Most systolics pre-pregnancy (137/92 08/31/20; 140/82 12/20/2020, 138/91 on 01/29/21).  This may represent CHTN, GHTN, vs Superimposed preeclampsia with the clinical picture muddled by her pre-existing proteinuria.  The patient is asymptomatic.   - continue procardia XL 60mg  - BMZ course - GBS culture - spoke with Dr. MFM, given asymptomatic at present can hold off on magnesium sulfate at present  2) Proteinuria - was seen and evaluated by [facility] nephrology (03/06/21) with normal renal  ultrasound  (03/19/21) .  Lab work up 01/15/21 included negative ANA , negative ANCA, serum protein electrophoresis normal other than low albumin, borderline elevated Kappa Free serum ( normal lambda and kappa/lambda ratio), negative glomerular basement membrane antibodies, normal C3 complement, borderline elevated C4 complement (36.6mg /dl (03/17/21 mg/dL) but no other work up to date.   - P/C ratio 2,485mg /g 12/11/20; 1,210 mg/g 04/05/21, and 5,159mg /g 05/23/21 - 24-hr urine protein 2,180mg /24hrs 12/20/20 - Baseline Cr 0.57 m g/dL on 02/19/21 - undergoing repeat 24-hr urine currently  3) Fetus  - BPP 8/8 on 05/21/21 with 2050g (4lbs 8oz) c/w 38%ile and AFI 15cm  4) Mode of delivery - patient with one C-section and two subsequent VBACs (include one breech).  Patient is a candidate to consider for TOLAC. - patient is interested in [facility] but was consider C-section to have BTL   [facility], MD, [facility], [facility] 05/25/2021, 11:02 AM

## 2021-05-25 NOTE — Progress Notes (Addendum)
Pt transferred to M/B unit Rm 347 until delivery scheduled for Wednesday 05/29/2021 if pt is stable. Pt still experiencing irregular contractions, pt states zero pain with contractions. Bps elevated but not treatable. Bonney Aid, MD placed orders for daily NST. 2 hours post prandial CBG and fasting am CBG to be collected. 24 hour protein urine collection still in progress until 1930 today. 2nd dose of betamethasone due at 2030 today. Report given to Ann Held

## 2021-05-26 LAB — GLUCOSE, CAPILLARY
Glucose-Capillary: 129 mg/dL — ABNORMAL HIGH (ref 70–99)
Glucose-Capillary: 156 mg/dL — ABNORMAL HIGH (ref 70–99)
Glucose-Capillary: 139 mg/dL — ABNORMAL HIGH (ref 70–99)
Glucose-Capillary: 134 mg/dL — ABNORMAL HIGH (ref 70–99)
Glucose-Capillary: 130 mg/dL — ABNORMAL HIGH (ref 70–99)

## 2021-05-26 LAB — COMPREHENSIVE METABOLIC PANEL
ALT: 9 U/L (ref 0–44)
AST: 11 U/L — ABNORMAL LOW (ref 15–41)
Albumin: 2.5 g/dL — ABNORMAL LOW (ref 3.5–5.0)
Alkaline Phosphatase: 96 U/L (ref 38–126)
Anion gap: 13 (ref 5–15)
BUN: 15 mg/dL (ref 6–20)
CO2: 18 mmol/L — ABNORMAL LOW (ref 22–32)
Calcium: 8.2 mg/dL — ABNORMAL LOW (ref 8.9–10.3)
Chloride: 103 mmol/L (ref 98–111)
Creatinine, Ser: 0.65 mg/dL (ref 0.44–1.00)
GFR, Estimated: >60 mL/min (ref >60)
Glucose, Bld: 121 mg/dL — ABNORMAL HIGH (ref 70–99)
Potassium: 3.9 mmol/L (ref 3.5–5.1)
Sodium: 134 mmol/L — ABNORMAL LOW (ref 135–145)
Total Bilirubin: 0.7 mg/dL (ref 0.3–1.2)
Total Protein: 5.8 g/dL — ABNORMAL LOW (ref 6.5–8.1)

## 2021-05-26 LAB — CBC
HCT: 35.2 % — ABNORMAL LOW (ref 36.0–46.0)
Hemoglobin: 11.8 g/dL — ABNORMAL LOW (ref 12.0–15.0)
MCH: 28.2 pg (ref 26.0–34.0)
MCHC: 33.5 g/dL (ref 30.0–36.0)
MCV: 84.2 fL (ref 80.0–100.0)
Platelets: 254 10*3/uL (ref 150–400)
RBC: 4.18 MIL/uL (ref 3.87–5.11)
RDW: 13.2 % (ref 11.5–15.5)
WBC: 10.3 10*3/uL (ref 4.0–10.5)
nRBC: 0 % (ref 0.0–0.2)

## 2021-05-26 NOTE — Progress Notes (Signed)
Chaplain Maggie made brief initial visit with patient with help of interpretor. Patient needed to leave the room for a stress test shortly after Chaplain arrived. Chaplain will follow up later today and is available per on call Chaplain at [phone].

## 2021-05-26 NOTE — Progress Notes (Addendum)
Pt to L&D for NST at this time. Pt denies bleeding, ctx, or LOF. Pt denies headache, blurry vision, or epigastric pain. Pt has 2+ reflexes and absent clonus. Pt reports positive fetal movement. VSS. Fetal monitoring applied at this time. Will continue to monitor.

## 2021-05-26 NOTE — Progress Notes (Signed)
Subjective:  Doing well,  +FM, no LOF, no VB, some irregular contractions.  Denies HA, vision changes, RUQ or epigastric pain, increased edema  Objective:  Temp:  [97.4 F (36.3 C)-98.7 F (37.1 C)] 98.1 F (36.7 C) (08/14 1125) Pulse Rate:  [90-121] 96 (08/14 1125) Resp:  [18-21] 18 (08/14 1125) BP: (118-152)/(52-98) 126/78 (08/14 1125) SpO2:  [95 %-98 %] 97 % (08/14 1125) Weight:  [87.1 kg] 87.1 kg (08/14 0615)  General: NAD Pulmonary: no increased work of breathing Abdomen:gravid, soft, non-tender Ext: no edema  FHT: 140, moderate, +accels, no decels Toco: none  Results for orders placed or performed during the hospital encounter of 05/24/21 (from the past 24 hour(s))  Glucose, capillary     Status: Abnormal   Collection Time: 05/25/21  2:23 PM  Result Value Ref Range   Glucose-Capillary 116 (H) 70 - 99 mg/dL  Culture, beta strep (group b only)     Status: None (Preliminary result)   Collection Time: 05/25/21  3:19 PM   Specimen: Vaginal/Rectal; Genital  Result Value Ref Range   Specimen Description      VAGINAL/RECTAL Performed at [facility], [address] [place], [place] [address]    Special Requests      NONE Performed at [facility], [address], [place], [place] [address]    Culture      CULTURE REINCUBATED FOR BETTER GROWTH Performed at [facility], [address], [place], [place] [address]    Report Status PENDING   CBC     Status: Abnormal   Collection Time: 05/25/21  6:14 PM  Result Value Ref Range   WBC 11.6 (H) 4.0 - 10.5 K/uL   RBC 4.25 3.87 - 5.11 MIL/uL   Hemoglobin 11.9 (L) 12.0 - 15.0 g/dL   HCT 97.9 89.2 - 11.9 %   MCV 85.4 80.0 - 100.0 fL   MCH 28.0 26.0 - 34.0 pg   MCHC 32.8 30.0 - 36.0 g/dL   RDW 41.7 40.8 - 14.4 %   Platelets 270 150 - 400 K/uL   nRBC 0.0 0.0 - 0.2 %  Comprehensive metabolic panel     Status: Abnormal   Collection Time: 05/25/21  6:14 PM  Result Value Ref Range   Sodium 134  (L) 135 - 145 mmol/L   Potassium 3.8 3.5 - 5.1 mmol/L   Chloride 105 98 - 111 mmol/L   CO2 22 22 - 32 mmol/L   Glucose, Bld 131 (H) 70 - 99 mg/dL   BUN 13 6 - 20 mg/dL   Creatinine, Ser 8.18 0.44 - 1.00 mg/dL   Calcium 8.6 (L) 8.9 - 10.3 mg/dL   Total Protein 6.2 (L) 6.5 - 8.1 g/dL   Albumin 2.6 (L) 3.5 - 5.0 g/dL   AST 13 (L) 15 - 41 U/L   ALT 9 0 - 44 U/L   Alkaline Phosphatase 105 38 - 126 U/L   Total Bilirubin 0.6 0.3 - 1.2 mg/dL   GFR, Estimated >56 >31 mL/min   Anion gap 7 5 - 15  Glucose, capillary     Status: Abnormal   Collection Time: 05/25/21  7:32 PM  Result Value Ref Range   Glucose-Capillary 121 (H) 70 - 99 mg/dL  Glucose, capillary     Status: Abnormal   Collection Time: 05/25/21 10:03 PM  Result Value Ref Range   Glucose-Capillary 109 (H) 70 - 99 mg/dL  Glucose, capillary     Status: Abnormal  Collection Time: 05/26/21  5:51 AM  Result Value Ref Range   Glucose-Capillary 129 (H) 70 - 99 mg/dL  CBC     Status: Abnormal   Collection Time: 05/26/21  6:58 AM  Result Value Ref Range   WBC 10.3 4.0 - 10.5 K/uL   RBC 4.18 3.87 - 5.11 MIL/uL   Hemoglobin 11.8 (L) 12.0 - 15.0 g/dL   HCT 62.9 (L) 52.8 - 41.3 %   MCV 84.2 80.0 - 100.0 fL   MCH 28.2 26.0 - 34.0 pg   MCHC 33.5 30.0 - 36.0 g/dL   RDW 24.4 01.0 - 27.2 %   Platelets 254 150 - 400 K/uL   nRBC 0.0 0.0 - 0.2 %  Comprehensive metabolic panel     Status: Abnormal   Collection Time: 05/26/21  6:58 AM  Result Value Ref Range   Sodium 134 (L) 135 - 145 mmol/L   Potassium 3.9 3.5 - 5.1 mmol/L   Chloride 103 98 - 111 mmol/L   CO2 18 (L) 22 - 32 mmol/L   Glucose, Bld 121 (H) 70 - 99 mg/dL   BUN 15 6 - 20 mg/dL   Creatinine, Ser 5.36 0.44 - 1.00 mg/dL   Calcium 8.2 (L) 8.9 - 10.3 mg/dL   Total Protein 5.8 (L) 6.5 - 8.1 g/dL   Albumin 2.5 (L) 3.5 - 5.0 g/dL   AST 11 (L) 15 - 41 U/L   ALT 9 0 - 44 U/L   Alkaline Phosphatase 96 38 - 126 U/L   Total Bilirubin 0.7 0.3 - 1.2 mg/dL   GFR, Estimated >64 >40  mL/min   Anion gap 13 5 - 15  Glucose, capillary     Status: Abnormal   Collection Time: 05/26/21 10:31 AM  Result Value Ref Range   Glucose-Capillary 156 (H) 70 - 99 mg/dL    Assessment:   35 y.o. [id number] [redacted]w[redacted]d admitted for elevated BP in setting of worsening chronic proteinuria  Plan:   1) Elevated BP in third trimester in setting of pre-existing but now worsening proteinuria - evaluation of patient prepregnancy blood pressures as well as prior to 20 weeks reveals that she likely suffers from stage II hypertension predating the pregnancy.  Most systolics pre-pregnancy (137/92 08/31/20; 140/82 12/20/2020, 138/91 on 01/29/21).  This may represent CHTN, GHTN, vs Superimposed preeclampsia with the clinical picture muddled by her pre-existing proteinuria.  The patient is asymptomatic.   - continue procardia XL 60mg  BP remains well controlled on this regimen - BMZ course - GBS culture - spoke with Dr. MFM 05/26/2021, given asymptomatic at present can hold off on magnesium sulfate at present.  Given improvement in blood pressure may consider later delivery MFM consult 8/15   2) Proteinuria - was seen and evaluated by [facility] nephrology (03/06/21) with normal renal ultrasound  (03/19/21) .  Lab work up 01/15/21 included negative ANA , negative ANCA, serum protein electrophoresis normal other than low albumin, borderline elevated Kappa Free serum ( normal lambda and kappa/lambda ratio), negative glomerular basement membrane antibodies, normal C3 complement, borderline elevated C4 complement (36.6mg /dl (03/17/21 mg/dL) but no other work up to date.   - P/C ratio 2,485mg /g 12/11/20; 1,210 mg/g 04/05/21, and 5,159mg /g 05/23/21 - 24-hr urine protein 2,180mg /24hrs 12/20/20 with repeat 24-hr urine this admission 3,430mg /24hrs 05/25/21 - Baseline Cr 0.57 m g/dL on 05/27/21   3) Fetus  - BPP 8/8 on 05/21/21 with 2050g (4lbs 8oz) c/w 38%ile and AFI 15cm - Reactive NST doay   4) Mode  of delivery - patient with one  C-section and two subsequent VBACs (include one breech).  Patient is a candidate to consider for TOLAC. - patient is interested in [facility] but was consider C-section to have BTL - patient worried about cost of postpartum BTL and was consider C-section because of this, clinical social work aware and to look at insurance status Monday 05/27/21  5) DVT ppx SCD's   Vena Austria, MD, [facility], [facility] 05/26/2021, 11:36 AM

## 2021-05-27 ENCOUNTER — Ambulatory Visit: Payer: Self-pay

## 2021-05-27 DIAGNOSIS — R808 Other proteinuria: Secondary | ICD-10-CM

## 2021-05-27 DIAGNOSIS — O99891 Other specified diseases and conditions complicating pregnancy: Secondary | ICD-10-CM

## 2021-05-27 DIAGNOSIS — O10913 Unspecified pre-existing hypertension complicating pregnancy, third trimester: Secondary | ICD-10-CM

## 2021-05-27 DIAGNOSIS — Z3A33 33 weeks gestation of pregnancy: Secondary | ICD-10-CM

## 2021-05-27 DIAGNOSIS — O1493 Unspecified pre-eclampsia, third trimester: Secondary | ICD-10-CM

## 2021-05-27 LAB — COMPREHENSIVE METABOLIC PANEL
ALT: 10 U/L (ref 0–44)
AST: 13 U/L — ABNORMAL LOW (ref 15–41)
Albumin: 2.4 g/dL — ABNORMAL LOW (ref 3.5–5.0)
Alkaline Phosphatase: 89 U/L (ref 38–126)
Anion gap: 6 (ref 5–15)
BUN: 16 mg/dL (ref 6–20)
CO2: 23 mmol/L (ref 22–32)
Calcium: 8.2 mg/dL — ABNORMAL LOW (ref 8.9–10.3)
Chloride: 106 mmol/L (ref 98–111)
Creatinine, Ser: 0.51 mg/dL (ref 0.44–1.00)
GFR, Estimated: >60 mL/min (ref >60)
Glucose, Bld: 101 mg/dL — ABNORMAL HIGH (ref 70–99)
Potassium: 4 mmol/L (ref 3.5–5.1)
Sodium: 135 mmol/L (ref 135–145)
Total Bilirubin: 0.5 mg/dL (ref 0.3–1.2)
Total Protein: 5.6 g/dL — ABNORMAL LOW (ref 6.5–8.1)

## 2021-05-27 LAB — GLUCOSE, CAPILLARY
Glucose-Capillary: 91 mg/dL (ref 70–99)
Glucose-Capillary: 124 mg/dL — ABNORMAL HIGH (ref 70–99)
Glucose-Capillary: 79 mg/dL (ref 70–99)
Glucose-Capillary: 97 mg/dL (ref 70–99)
Glucose-Capillary: 76 mg/dL (ref 70–99)

## 2021-05-27 LAB — CULTURE, BETA STREP (GROUP B ONLY)

## 2021-05-27 LAB — CBC
HCT: 34.7 % — ABNORMAL LOW (ref 36.0–46.0)
Hemoglobin: 11.4 g/dL — ABNORMAL LOW (ref 12.0–15.0)
MCH: 27.4 pg (ref 26.0–34.0)
MCHC: 32.9 g/dL (ref 30.0–36.0)
MCV: 83.4 fL (ref 80.0–100.0)
Platelets: 242 10*3/uL (ref 150–400)
RBC: 4.16 MIL/uL (ref 3.87–5.11)
RDW: 13.1 % (ref 11.5–15.5)
WBC: 9.4 10*3/uL (ref 4.0–10.5)
nRBC: 0 % (ref 0.0–0.2)

## 2021-05-27 MED ORDER — SODIUM CHLORIDE 0.9% FLUSH: 3.0000 mL | INTRAVENOUS | Status: DC | PRN

## 2021-05-27 NOTE — Progress Notes (Signed)
Daily Antepartum Note  Admission Date: 05/24/2021 Current Date: 05/27/2021 10:44 AM  Letha Cape Alfonso Ellis is a 35 y.o. [phone] @ [redacted]w[redacted]d by ultrasound,  admitted for hypertension and proteinuria. 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 Pregnancy complicated by: HTN, Gestational diabetes, history of CS x 2, obesity and hx of preterm labor. She has been admitted for antepartal surveillance and closer fetal monitoring. Patient Active Problem List   Diagnosis Date Noted   Preeclampsia 05/24/2021   Gestational diabetes 04/26/21 04/29/2021   UTI (urinary tract infection) during pregnancy dx'd [facility] on 01/29/21 02/08/2021   Poor historian 02/08/2021   Proteinuria affecting pregnancy 12/11/20 spot protein/creat ratio=2,485 12/14/2020   Obesity affecting pregnancy BMI=35.6 12/11/2020   Supervision of high risk pregnancy in second trimester 12/11/2020   Late prenatal care 14 3/7 12/11/2020   History of preterm delivery 12/01/13 34 wks & 02/07/15 at 36 12/11/2020   Low birth weight infant 5 lbs 02/07/15 12/11/2020   Hx macrosomic infant 9 lbs 06/17/11 12/11/2020   Depression affecting pregnancy 12/11/2020   Hemorrhage after delivery of fetus on 06/17/11 500 cc 12/11/2020   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 Logan Bores) 08/10/2014    Overnight/24hr events:  She has done well over night, with her Bps generally in normal range. She is taking Procardia XL 60 mg. Her NST yesterday was reactive. No severe range blood pressures.  Subjective:  This morningn she denies any headache or contractions. Her baby is moving well. She denies any swelling. Is wearing compression stockings.  Objective:   Vitals:   05/27/21 0325 05/27/21 0832  BP: 124/86 (!) 122/96  Pulse: 78 79  Resp: 18 16  Temp: 98.3 F (36.8 C) 98.2 F (36.8 C)  SpO2: 99% 97%   Temp:  [97.4 F  (36.3 C)-98.4 F (36.9 C)] 98.2 F (36.8 C) (08/15 0832) Pulse Rate:  [78-102] 79 (08/15 0832) Resp:  [16-18] 16 (08/15 0832) BP: (122-141)/(78-96) 122/96 (08/15 0832) SpO2:  [95 %-99 %] 97 % (08/15 0832) Temp (24hrs), Avg:98.1 F (36.7 C), Min:97.4 F (36.3 C), Max:98.4 F (36.9 C)   Intake/Output Summary (Last 24 hours) at 05/27/2021 1044 Last data filed at 05/27/2021 0230 Gross per 24 hour  Intake 600 ml  Output 1800 ml  Net -1200 ml     Current Vital Signs 24h Vital Sign Ranges  T 98.2 F (36.8 C) Temp  Avg: 98.1 F (36.7 C)  Min: 97.4 F (36.3 C)  Max: 98.4 F (36.9 C)  BP (!) 122/96  BP  Min: 122/96  Max: 141/84  HR 79 Pulse  Avg: 89  Min: 78  Max: 102  RR 16 Resp  Avg: 17.4  Min: 16  Max: 18  SaO2 97 % Room Air SpO2  Avg: 97.4 %  Min: 95 %  Max: 99 %       24 Hour I/O Current Shift I/O  Time Ins Outs 08/14 0701 - 08/15 0700 In: 840 [P.O.:840] Out: 1800 [Urine:1800] No intake/output data recorded.   Patient Vitals for the past 24 hrs:  BP Temp Temp src Pulse Resp SpO2  05/27/21 0832 (!) 122/96 98.2 F (36.8 C) Oral 79 16 97 %  05/27/21 0325 124/86 98.3 F (36.8 C) Oral 78 18 99 %  05/26/21 2322 127/86 98.3 F (36.8 C) Oral 81 18 97 %  05/26/21 1943 129/85 98.4 F (36.9 C) Oral 92 16 99 %  05/26/21 1501 (!) 141/84 97.7 F (36.5 C) Oral 95 18 98 %  05/26/21 1125 126/78 98.1 F (36.7 C) Oral 96 18 97 %  05/26/21 1108 (!) 141/84 (!) 97.4 F (36.3 C) Oral (!) 102 18 95 %    Physical exam: General: Well nourished, well developed female in no acute distress. Abdomen: gravid + bowel sounds all 4 quadrants, abdomen is soft. Cardiovascular: S1, S2 normal, no murmur, rub or gallop, regular rate and rhythm Respiratory: CTAB Extremities: no clubbing, cyanosis or edema Skin: Warm and dry.   Medications: Current Facility-Administered Medications  Medication Dose Route Frequency Provider Last Rate Last Admin   0.9 %  sodium chloride infusion  250 mL  Intravenous PRN Nadara Mustard, MD       acetaminophen (TYLENOL) tablet 650 mg  650 mg Oral Q4H PRN Nadara Mustard, MD       calcium carbonate (TUMS - dosed in mg elemental calcium) chewable tablet 400 mg of elemental calcium  2 tablet Oral Q4H PRN Nadara Mustard, MD       docusate sodium (COLACE) capsule 100 mg  100 mg Oral Daily Nadara Mustard, MD   100 mg at 05/26/21 0825   labetalol (NORMODYNE) injection 20 mg  20 mg Intravenous PRN Nadara Mustard, MD       And   labetalol (NORMODYNE) injection 40 mg  40 mg Intravenous PRN Nadara Mustard, MD       And   labetalol (NORMODYNE) injection 80 mg  80 mg Intravenous PRN Nadara Mustard, MD       And   hydrALAZINE (APRESOLINE) injection 10 mg  10 mg Intravenous PRN Nadara Mustard, MD       insulin aspart (novoLOG) injection 0-16 Units  0-16 Units Subcutaneous TID Valrie Hart, MD   3 Units at 05/26/21 2021   NIFEdipine (PROCARDIA-XL/NIFEDICAL-XL) 24 hr tablet 60 mg  60 mg Oral Daily Nadara Mustard, MD   60 mg at 05/26/21 2024   prenatal multivitamin tablet 1 tablet  1 tablet Oral Q1200 Nadara Mustard, MD   1 tablet at 05/26/21 0824   sodium chloride flush (NS) 0.9 % injection 3 mL  3 mL Intravenous Q12H Nadara Mustard, MD   3 mL at 05/26/21 2025   sodium chloride flush (NS) 0.9 % injection 3 mL  3 mL Intravenous PRN Nadara Mustard, MD       zolpidem (AMBIEN) tablet 5 mg  5 mg Oral QHS PRN Nadara Mustard, MD        Labs:  Recent Labs  Lab 05/25/21 1814 05/26/21 0658 05/27/21 0427  WBC 11.6* 10.3 9.4  HGB 11.9* 11.8* 11.4*  HCT 36.3 35.2* 34.7*  PLT 270 254 242    Recent Labs  Lab 05/25/21 1814 05/26/21 0658 05/27/21 0427  NA 134* 134* 135  K 3.8 3.9 4.0  CL 105 103 106  CO2 22 18* 23  BUN 13 15 16   CREATININE 0.59 0.65 0.51  CALCIUM 8.6* 8.2* 8.2*  PROT 6.2* 5.8* 5.6*  BILITOT 0.6 0.7 0.5  ALKPHOS 105 96 89  ALT 9 9 10   AST 13* 11* 13*  GLUCOSE 131* 121* 101*   Radiology:  NA   Assessment & Plan:  IUP 33 weeks 5 days, with suspected CHTN and superimposed pre eclampsia. proteinuria *Pregnancy:stable, no preterm contractions  Plan is to deliver  at [redacted] weeks gestation via CS, and/or per MFM recommendations. Daily NSTs, [place] with BPP every third day. Follow blood pressures carefully. Spanish speaking only- needs translation for meetings with MFM, clinical providers.     Mirna Mires, CNM  05/27/2021 10:44 AM

## 2021-05-27 NOTE — Progress Notes (Signed)
   05/27/21 1300  Clinical Encounter Type  Visited With Patient  Visit Type Follow-up  Referral From Nurse  Consult/Referral To Chaplain  Spiritual Encounters  Spiritual Needs Emotional;Prayer  Chaplain Arnell Mausolf did a FU visit for in OBS-3 Pt, Sandra Maynard. Nurses informed me that the pt did not speak English well. I advised them I will visit anyway and if need be I will get an interpreter for my next visit. I entered the pt's room and she spoke english pretty good. She understood it well enough to know I was a Orthoptist. I asked if I could pray and she said yes. I provided a ministry of presence and prayer and I advised the nurses station that I will follow up tomorrow with an interpreter.

## 2021-05-27 NOTE — Progress Notes (Signed)
Patient to L&D for NST. Used language line, interpreter ID [id number], Byrd Hesselbach. Patient denies any ctx, LOF or bleeding. Positive fetal movement reported. VSS. Patient denies any pain. Monitors applied and assessing.

## 2021-05-28 DIAGNOSIS — R808 Other proteinuria: Secondary | ICD-10-CM

## 2021-05-28 DIAGNOSIS — O1213 Gestational proteinuria, third trimester: Secondary | ICD-10-CM

## 2021-05-28 DIAGNOSIS — O1413 Severe pre-eclampsia, third trimester: Secondary | ICD-10-CM

## 2021-05-28 DIAGNOSIS — O34219 Maternal care for unspecified type scar from previous cesarean delivery: Secondary | ICD-10-CM

## 2021-05-28 DIAGNOSIS — O1493 Unspecified pre-eclampsia, third trimester: Secondary | ICD-10-CM

## 2021-05-28 DIAGNOSIS — O99891 Other specified diseases and conditions complicating pregnancy: Secondary | ICD-10-CM

## 2021-05-28 DIAGNOSIS — Z3A33 33 weeks gestation of pregnancy: Secondary | ICD-10-CM

## 2021-05-28 DIAGNOSIS — O24414 Gestational diabetes mellitus in pregnancy, insulin controlled: Secondary | ICD-10-CM

## 2021-05-28 LAB — GLUCOSE, CAPILLARY
Glucose-Capillary: 102 mg/dL — ABNORMAL HIGH (ref 70–99)
Glucose-Capillary: 118 mg/dL — ABNORMAL HIGH (ref 70–99)
Glucose-Capillary: 87 mg/dL (ref 70–99)
Glucose-Capillary: 123 mg/dL — ABNORMAL HIGH (ref 70–99)
Glucose-Capillary: 78 mg/dL (ref 70–99)

## 2021-05-28 MED ORDER — CEFAZOLIN SODIUM-DEXTROSE 2-4 GM/100ML-% IV SOLN
2.0000 g | INTRAVENOUS | Status: AC
  Administered 2021-05-29: 2 g via INTRAVENOUS
  Filled 2021-05-28: qty 100

## 2021-05-28 NOTE — Consult Note (Signed)
MFM Note  Sandra Maynard is a 35 year old gravida 4 para 1-2-0-3 currently at 33 weeks and 6 days.  She was seen in consultation due to preeclampsia.  The patient was admitted 5 days ago due to elevated blood pressures that were noted during her prenatal visit.    Her pregnancy has been complicated by significant proteinuria (2180 mg) that was noted earlier in her pregnancy.  The patient denied any history of high blood pressure earlier in her pregnancy.   Since being admitted, she had a 24-hour urine collection that showed a total of 3430 mg of protein.    She has required treatment with nifedipine XL 60 mg daily for blood pressure control since admission.  Despite treatment with nifedipine, her blood pressures remain in the 120s to 150s over 90s to 100s range.    She denies any signs or symptoms of severe preeclampsia.  Her PIH labs have been within normal limits.  Her current pregnancy has also been complicated by insulin controlled gestational diabetes.  Due to preeclampsia, she has already received a complete course of antenatal corticosteroids.  She had a growth ultrasound performed in our office last week that showed an EFW of 4 pounds 8 ounces (38th percentile) with normal amniotic fluid.  Her past pregnancy history includes a C-section and 2 vaginal deliveries.  The implications and management of preeclampsia was discussed with the patient.  She was advised that preeclampsia can affect both the mother and the fetus.  In the mother, preeclampsia may cause a rise in blood pressures and it can affect the mother's kidney, liver, and platelet functions.  It may also cause the mother to have seizures.  In the fetus, it may cause growth restriction and oligohydramnios.  She understands that delivery is the only treatment for preeclampsia.  The patient was advised that as she had significant proteinuria noted earlier in her current pregnancy, the diagnosis of preeclampsia will be based  primarily on her elevated blood pressures.    Since the patient's blood pressures are now elevated (it was normal earlier in her pregnancy) despite treatment with a high dose of nifedipine in addition to the elevated protein noted in her urine, I believe that she has severe preeclampsia and therefore delivery is recommended at 34 weeks.  The patient stated that she would like to be delivered tomorrow via a repeat C-section so that she can have a tubal ligation performed.  Due to preeclampsia, she should receive magnesium sulfate for maternal seizure prophylaxis for 24 hours postpartum.    The patient understands that her baby will require a NICU admission for delivery at her current gestational age.  However, the long-term neonatal outcomes for infants delivered at 34 weeks is usually good.  She also understands that many women with severe preeclampsia may require antihypertensive medications for blood pressure control up to 6 to 8 weeks postpartum.  At the end of the consultation, the patient stated that all of her questions had been answered to her complete satisfaction.    All conversations were held with the patient today with the help of a Spanish interpreter.  Recommendations:  Delivery at 34 weeks (tomorrow) Magnesium sulfate for maternal seizure prophylaxis for 24 hours postpartum Continue antihypertensive treatment postpartum if necessary

## 2021-05-28 NOTE — Progress Notes (Signed)
Daily Antepartum Note  Admission Date: 05/24/2021 Current Date: 05/28/2021 12:47 PM  Sandra Maynard is a 35 y.o. [phone] @ [redacted]w[redacted]d by 14 week ultrasound HD#5, admitted for worsening BPs and worsening proteinuria.  Pregnancy complicated by:  Patient Active Problem List   Diagnosis Date Noted   Preeclampsia 05/24/2021   Gestational diabetes 04/26/21 04/29/2021   UTI (urinary tract infection) during pregnancy dx'd [facility] on 01/29/21 02/08/2021   Poor historian 02/08/2021   Proteinuria affecting pregnancy 12/11/20 spot protein/creat ratio=2,485 12/14/2020   Obesity affecting pregnancy BMI=35.6 12/11/2020   Supervision of high risk pregnancy in second trimester 12/11/2020   Late prenatal care 14 3/7 12/11/2020   History of preterm delivery 12/01/13 34 wks & 02/07/15 at 36 12/11/2020   Low birth weight infant 5 lbs 02/07/15 12/11/2020   Hx macrosomic infant 9 lbs 06/17/11 12/11/2020   Depression affecting pregnancy 12/11/2020   Hemorrhage after delivery of fetus on 06/17/11 500 cc 12/11/2020   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 Logan Bores) 08/10/2014    Overnight/24hr events:  No acute events overnight  Subjective:  Notes +FM, no vaginal bleeding, no LOF. Occasional ctx.  Denies HAs, visual changes, and RUQ pain.   Objective:   Vitals:   05/28/21 0809 05/28/21 1115  BP: (!) 156/101 (!) 145/90  Pulse: 81 85  Resp: 18   Temp: 98.2 F (36.8 C)   SpO2: 98%    Temp:  [98 F (36.7 C)-98.5 F (36.9 C)] 98.2 F (36.8 C) (08/16 0809) Pulse Rate:  [65-85] 85 (08/16 1115) Resp:  [18-19] 18 (08/16 0809) BP: (131-156)/(85-101) 145/90 (08/16 1115) SpO2:  [96 %-98 %] 98 % (08/16 0809) Weight:  [86.4 kg] 86.4 kg (08/15 1935) Temp (24hrs), Avg:98.2 F (36.8 C), Min:98 F (36.7 C), Max:98.5 F (36.9 C)   Intake/Output Summary (Last 24 hours) at 05/28/2021 1247 Last data  filed at 05/28/2021 1015 Gross per 24 hour  Intake 240 ml  Output 1850 ml  Net -1610 ml     Current Vital Signs 24h Vital Sign Ranges  T 98.2 F (36.8 C) Temp  Avg: 98.2 F (36.8 C)  Min: 98 F (36.7 C)  Max: 98.5 F (36.9 C)  BP (!) 145/90  BP  Min: 131/88  Max: 156/101  HR 85 Pulse  Avg: 76.2  Min: 65  Max: 85  RR 18 Resp  Avg: 18.2  Min: 18  Max: 19  SaO2 98 % Room Air SpO2  Avg: 97.5 %  Min: 96 %  Max: 98 %       24 Hour I/O Current Shift I/O  Time Ins Outs 08/15 0701 - 08/16 0700 In: 240 [P.O.:240] Out: 1200 [Urine:1200] 08/16 0701 - 08/16 1900 In: 240 [P.O.:240] Out: 650 [Urine:650]   Patient Vitals for the past 24 hrs:  BP Temp Temp src Pulse Resp SpO2 Weight  05/28/21 1115 (!) 145/90 -- -- 85 -- -- --  05/28/21 0809 (!) 156/101 98.2 F (36.8 C) Oral 81 18 98 % --  05/28/21 0323 (!) 143/86 98 F (36.7 C) Oral 72 18 98 % --  05/27/21 2307 (!) 150/96 98.1 F (36.7 C) Oral 65 18 98 % --  05/27/21 1935 -- -- -- -- -- -- 86.4 kg  05/27/21 1912 131/88 98.5 F (36.9 C) Oral 77 18 96 % --  05/27/21 1358 132/85 98.3 F (36.8  C) Oral 77 19 -- --   NST: Baseline FHR: 135 beats/min Variability: moderate Accelerations: present Decelerations: absent Tocometry: quiet  Interpretation:  INDICATIONS: gestational diabetes mellitus and superimposed preeclampsia based on worsening blood pressures with borderline pressures treated with high dose nifedipine.  RESULTS:  A NST procedure was performed with FHR monitoring and a normal baseline established, appropriate time of 20-40 minutes of evaluation, and accels >2 seen w 15x15 characteristics.  Results show a REACTIVE NST.    Physical exam: General: Well nourished, well developed female in no acute distress. Abdomen: gravid non-tender Cardiovascular: S1, S2 normal, no murmur, rub or gallop, regular rate and rhythm Respiratory: CTAB Extremities: no clubbing, cyanosis or edema Skin: Warm and dry.   Medications: Current  Facility-Administered Medications  Medication Dose Route Frequency Provider Last Rate Last Admin   acetaminophen (TYLENOL) tablet 650 mg  650 mg Oral Q4H PRN Nadara Mustard, MD       calcium carbonate (TUMS - dosed in mg elemental calcium) chewable tablet 400 mg of elemental calcium  2 tablet Oral Q4H PRN Nadara Mustard, MD       docusate sodium (COLACE) capsule 100 mg  100 mg Oral Daily Nadara Mustard, MD   100 mg at 05/27/21 1118   labetalol (NORMODYNE) injection 20 mg  20 mg Intravenous PRN Nadara Mustard, MD       And   labetalol (NORMODYNE) injection 40 mg  40 mg Intravenous PRN Nadara Mustard, MD       And   labetalol (NORMODYNE) injection 80 mg  80 mg Intravenous PRN Nadara Mustard, MD       And   hydrALAZINE (APRESOLINE) injection 10 mg  10 mg Intravenous PRN Nadara Mustard, MD       insulin aspart (novoLOG) injection 0-16 Units  0-16 Units Subcutaneous TID Valrie Hart, MD   2 Units at 05/28/21 1102   NIFEdipine (PROCARDIA-XL/NIFEDICAL-XL) 24 hr tablet 60 mg  60 mg Oral Daily Nadara Mustard, MD   60 mg at 05/27/21 1935   prenatal multivitamin tablet 1 tablet  1 tablet Oral Q1200 Nadara Mustard, MD   1 tablet at 05/27/21 1118   sodium chloride flush (NS) 0.9 % injection 3 mL  3 mL Intravenous PRN Nadara Mustard, MD       zolpidem (AMBIEN) tablet 5 mg  5 mg Oral QHS PRN Nadara Mustard, MD        Labs:  Recent Labs  Lab 05/25/21 1814 05/26/21 0658 05/27/21 0427  WBC 11.6* 10.3 9.4  HGB 11.9* 11.8* 11.4*  HCT 36.3 35.2* 34.7*  PLT 270 254 242    Recent Labs  Lab 05/25/21 1814 05/26/21 0658 05/27/21 0427  NA 134* 134* 135  K 3.8 3.9 4.0  CL 105 103 106  CO2 22 18* 23  BUN 13 15 16   CREATININE 0.59 0.65 0.51  CALCIUM 8.6* 8.2* 8.2*  PROT 6.2* 5.8* 5.6*  BILITOT 0.6 0.7 0.5  ALKPHOS 105 96 89  ALT 9 9 10   AST 13* 11* 13*  GLUCOSE 131* 121* 101*    Assessment & Plan:  35 y.o. female at [redacted]w[redacted]d with superimposed severe  preeclampsia based on worsening proteinuria and blood pressures requiring high-dose nifedipine.  *Pregnancy:PNVs * Superimposed severe preeclampsia: delivery at 34 weeks per MFM. Scheduled for repeat c-section tomorrow based on patient's desire for BTL and history of c-section.  She will have mag sulfate x 24 hours  postpartum.  See MFM note from today. I spoke directly with Dr. Parke Poisson after her discussed the treatment plan with her.  *Preterm: s/p BMTZ *PPx: SCDs *FEN/GI: carb controlled diet with GDM>  *Dispo: home after delivery  Thomasene Mohair, MD, [facility], [facility] 05/28/2021 12:53 PM

## 2021-05-28 NOTE — TOC Initial Note (Signed)
Transition of Care [facility]) - Initial/Assessment Note    Patient Details  Name: Sandra Maynard MRN: [id number] Date of Birth: [birth year]  Transition of Care [facility]) CM/SW Contact:    Scooba Cellar, RN Phone Number: 05/28/2021, 4:16 PM  Clinical Narrative:                 Confirmed with Jewel Baize, Medicaid application has been started with patient. Patient is scheduled for C/S tomorrow and TOC will follow up prior to discharge.         Patient Goals and CMS Choice        Expected Discharge Plan and Services                                                Prior Living Arrangements/Services                       Activities of Daily Living Home Assistive Devices/Equipment: None ADL Screening (condition at time of admission) Patient's cognitive ability adequate to safely complete daily activities?: Yes Is the patient deaf or have difficulty hearing?: No Does the patient have difficulty seeing, even when wearing glasses/contacts?: No Does the patient have difficulty concentrating, remembering, or making decisions?: No Patient able to express need for assistance with ADLs?: Yes Does the patient have difficulty dressing or bathing?: No Independently performs ADLs?: Yes (appropriate for developmental age) Does the patient have difficulty walking or climbing stairs?: No Weakness of Legs: None Weakness of Arms/Hands: None  Permission Sought/Granted                  Emotional Assessment              Admission diagnosis:  Preeclampsia [O14.90] Patient Active Problem List   Diagnosis Date Noted   Preeclampsia 05/24/2021   Gestational diabetes 04/26/21 04/29/2021   UTI (urinary tract infection) during pregnancy dx'd [facility] ER on 01/29/21 02/08/2021   Poor historian 02/08/2021   Proteinuria affecting pregnancy 12/11/20 spot protein/creat ratio=2,485 12/14/2020   Obesity affecting pregnancy BMI=35.6 12/11/2020   Supervision of high risk pregnancy in  second trimester 12/11/2020   Late prenatal care 14 3/7 12/11/2020   History of preterm delivery 12/01/13 34 wks & 02/07/15 at 36 12/11/2020   Low birth weight infant 5 lbs 02/07/15 12/11/2020   Hx macrosomic infant 9 lbs 06/17/11 12/11/2020   Depression affecting pregnancy 12/11/2020   Hemorrhage after delivery of fetus on 06/17/11 500 cc 12/11/2020   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 Logan Bores) 08/10/2014   PCP:  Department, [facility]:   [facility] [address] [place] - [address] [place] [address] Phone: [phone] Fax: [phone]     Social Determinants of Health (SDOH) Interventions    Readmission Risk Interventions No flowsheet data found.

## 2021-05-28 NOTE — Anesthesia Preprocedure Evaluation (Addendum)
Anesthesia Evaluation  Patient identified by MRN, date of birth, ID band  Reviewed: Allergy & Precautions, NPO status , Patient's Chart, lab work & pertinent test results  Airway Mallampati: III  TM Distance: >3 FB Neck ROM: Full    Dental no notable dental hx.    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)    GI/Hepatic negative GI ROS, Neg liver ROS,   Endo/Other  diabetes, Gestational  Renal/GU negative Renal ROS     Musculoskeletal   Abdominal   Peds  Hematology negative hematology ROS (+)   Anesthesia Other Findings Gravid  Reproductive/Obstetrics (+) Pregnancy (h/o CD x2)                            Anesthesia Physical Anesthesia Plan  ASA: 3  Anesthesia Plan: Spinal   Post-op Pain Management:    Induction:   PONV Risk Score and Plan: 2 and Treatment may vary due to age or medical condition and Ondansetron  Airway Management Planned: Natural Airway  Additional Equipment:   Intra-op Plan:   Post-operative Plan:   Informed Consent: I have reviewed the patients History and Physical, chart, labs and discussed the procedure including the risks, benefits and alternatives for the proposed anesthesia with the patient or authorized representative who has indicated his/her understanding and acceptance.     Dental advisory given and Interpreter used for interveiw  Plan Discussed with: CRNA and Anesthesiologist  Anesthesia Plan Comments:        Anesthesia Quick Evaluation

## 2021-05-29 ENCOUNTER — Inpatient Hospital Stay: Payer: Medicaid Other | Admitting: Anesthesiology

## 2021-05-29 ENCOUNTER — Encounter: Payer: Self-pay | Admitting: Obstetrics & Gynecology

## 2021-05-29 ENCOUNTER — Encounter
Admission: EM | Disposition: A | Discharge status: Home / Self Care | Payer: Self-pay | Source: Home / Self Care | Attending: Obstetrics & Gynecology

## 2021-05-29 DIAGNOSIS — O24424 Gestational diabetes mellitus in childbirth, insulin controlled: Secondary | ICD-10-CM

## 2021-05-29 DIAGNOSIS — Z3A34 34 weeks gestation of pregnancy: Secondary | ICD-10-CM

## 2021-05-29 DIAGNOSIS — O1494 Unspecified pre-eclampsia, complicating childbirth: Secondary | ICD-10-CM

## 2021-05-29 DIAGNOSIS — Z302 Encounter for sterilization: Secondary | ICD-10-CM

## 2021-05-29 LAB — TYPE AND SCREEN
ABO/RH(D): O POS
Antibody Screen: NEGATIVE

## 2021-05-29 LAB — CBC
HCT: 36.5 % (ref 36.0–46.0)
Hemoglobin: 12.4 g/dL (ref 12.0–15.0)
MCH: 28.7 pg (ref 26.0–34.0)
MCHC: 34 g/dL (ref 30.0–36.0)
MCV: 84.5 fL (ref 80.0–100.0)
Platelets: 218 10*3/uL (ref 150–400)
RBC: 4.32 MIL/uL (ref 3.87–5.11)
RDW: 12.9 % (ref 11.5–15.5)
WBC: 9 10*3/uL (ref 4.0–10.5)
nRBC: 0 % (ref 0.0–0.2)

## 2021-05-29 LAB — RAPID HIV SCREEN (HIV 1/2 AB+AG)
HIV 1/2 Antibodies: NONREACTIVE
HIV-1 P24 Antigen - HIV24: NONREACTIVE

## 2021-05-29 LAB — GLUCOSE, CAPILLARY
Glucose-Capillary: 71 mg/dL (ref 70–99)
Glucose-Capillary: 74 mg/dL (ref 70–99)
Glucose-Capillary: 66 mg/dL — ABNORMAL LOW (ref 70–99)
Glucose-Capillary: 73 mg/dL (ref 70–99)

## 2021-05-29 LAB — CREATININE, SERUM
Creatinine, Ser: 0.47 mg/dL (ref 0.44–1.00)
GFR, Estimated: >60 mL/min (ref >60)

## 2021-05-29 LAB — RPR: RPR Ser Ql: NONREACTIVE

## 2021-05-29 SURGERY — Surgical Case
Anesthesia: Spinal | Laterality: Bilateral

## 2021-05-29 MED ORDER — BUPIVACAINE IN DEXTROSE 0.75-8.25 % IT SOLN
INTRATHECAL | Status: DC | PRN
  Administered 2021-05-29: 1.6 mL via INTRATHECAL

## 2021-05-29 MED ORDER — BUPIVACAINE ON-Q PAIN PUMP (FOR ORDER SET NO CHG): INJECTION | Status: DC

## 2021-05-29 MED ORDER — ACETAMINOPHEN 325 MG PO TABS
650.0000 mg | ORAL_TABLET | ORAL | Status: DC | PRN
  Administered 2021-05-31: 650 mg via ORAL
  Filled 2021-05-29: qty 2

## 2021-05-29 MED ORDER — OXYTOCIN-SODIUM CHLORIDE 30-0.9 UT/500ML-% IV SOLN
INTRAVENOUS | Status: DC | PRN
  Administered 2021-05-29: 30 [IU] via INTRAVENOUS

## 2021-05-29 MED ORDER — KETOROLAC TROMETHAMINE 30 MG/ML IJ SOLN
30.0000 mg | Freq: Four times a day (QID) | INTRAMUSCULAR | Status: DC
  Administered 2021-05-29 – 2021-05-30 (×3): 30 mg via INTRAVENOUS
  Filled 2021-05-29 (×3): qty 1

## 2021-05-29 MED ORDER — OXYTOCIN-SODIUM CHLORIDE 30-0.9 UT/500ML-% IV SOLN
INTRAVENOUS | Status: AC
  Administered 2021-05-29: 2.5 [IU]/h via INTRAVENOUS
  Filled 2021-05-29: qty 1000

## 2021-05-29 MED ORDER — MENTHOL 3 MG MT LOZG
1.0000 | LOZENGE | OROMUCOSAL | Status: DC | PRN
  Filled 2021-05-29: qty 9

## 2021-05-29 MED ORDER — IBUPROFEN 600 MG PO TABS: 600.0000 mg | ORAL_TABLET | Freq: Four times a day (QID) | ORAL | Status: DC

## 2021-05-29 MED ORDER — OXYTOCIN-SODIUM CHLORIDE 30-0.9 UT/500ML-% IV SOLN: 2.5000 [IU]/h | INTRAVENOUS | Status: AC

## 2021-05-29 MED ORDER — BUPIVACAINE HCL (PF) 0.5 % IJ SOLN: 5.0000 mL | Freq: Once | INTRAMUSCULAR | Status: DC

## 2021-05-29 MED ORDER — SODIUM CHLORIDE 0.9% FLUSH: 3.0000 mL | INTRAVENOUS | Status: DC | PRN

## 2021-05-29 MED ORDER — KETOROLAC TROMETHAMINE 30 MG/ML IJ SOLN
INTRAMUSCULAR | Status: DC | PRN
  Administered 2021-05-29: 30 mg via INTRAVENOUS

## 2021-05-29 MED ORDER — SODIUM CHLORIDE 0.9 % IV SOLN
INTRAVENOUS | Status: DC | PRN
  Administered 2021-05-29: 20 ug/min via INTRAVENOUS

## 2021-05-29 MED ORDER — SIMETHICONE 80 MG PO CHEW
80.0000 mg | CHEWABLE_TABLET | Freq: Three times a day (TID) | ORAL | Status: DC
  Administered 2021-05-29 – 2021-05-31 (×6): 80 mg via ORAL
  Filled 2021-05-29 (×5): qty 1

## 2021-05-29 MED ORDER — HYDRALAZINE HCL 20 MG/ML IJ SOLN: 10.0000 mg | INTRAMUSCULAR | Status: DC | PRN

## 2021-05-29 MED ORDER — CEFAZOLIN SODIUM-DEXTROSE 2-4 GM/100ML-% IV SOLN
INTRAVENOUS | Status: AC
  Filled 2021-05-29: qty 100

## 2021-05-29 MED ORDER — KETOROLAC TROMETHAMINE 30 MG/ML IJ SOLN: 30.0000 mg | Freq: Four times a day (QID) | INTRAMUSCULAR | Status: AC | PRN

## 2021-05-29 MED ORDER — ZOLPIDEM TARTRATE 5 MG PO TABS: 5.0000 mg | ORAL_TABLET | Freq: Every evening | ORAL | Status: DC | PRN

## 2021-05-29 MED ORDER — FENTANYL CITRATE (PF) 100 MCG/2ML IJ SOLN
INTRAMUSCULAR | Status: DC | PRN
  Administered 2021-05-29: 15 ug via INTRATHECAL

## 2021-05-29 MED ORDER — OXYCODONE HCL 5 MG PO TABS: 5.0000 mg | ORAL_TABLET | ORAL | Status: DC | PRN

## 2021-05-29 MED ORDER — NALBUPHINE HCL 10 MG/ML IJ SOLN: 5.0000 mg | Freq: Once | INTRAMUSCULAR | Status: DC | PRN

## 2021-05-29 MED ORDER — FENTANYL CITRATE (PF) 100 MCG/2ML IJ SOLN
INTRAMUSCULAR | Status: DC | PRN
  Administered 2021-05-29 (×2): 25 ug via INTRAVENOUS
  Administered 2021-05-29: 35 ug via INTRAVENOUS

## 2021-05-29 MED ORDER — PRENATAL MULTIVITAMIN CH
1.0000 | ORAL_TABLET | Freq: Every day | ORAL | Status: DC
  Administered 2021-05-29 – 2021-05-31 (×3): 1 via ORAL
  Filled 2021-05-29 (×3): qty 1

## 2021-05-29 MED ORDER — DIPHENHYDRAMINE HCL 25 MG PO CAPS: 25.0000 mg | ORAL_CAPSULE | Freq: Four times a day (QID) | ORAL | Status: DC | PRN

## 2021-05-29 MED ORDER — SCOPOLAMINE 1 MG/3DAYS TD PT72
1.0000 | MEDICATED_PATCH | Freq: Once | TRANSDERMAL | Status: DC
  Administered 2021-05-29: 1.5 mg via TRANSDERMAL
  Filled 2021-05-29: qty 1

## 2021-05-29 MED ORDER — SENNOSIDES-DOCUSATE SODIUM 8.6-50 MG PO TABS
2.0000 | ORAL_TABLET | ORAL | Status: DC
  Administered 2021-05-29 – 2021-05-30 (×2): 2 via ORAL
  Filled 2021-05-29 (×2): qty 2

## 2021-05-29 MED ORDER — NALBUPHINE HCL 10 MG/ML IJ SOLN: 5.0000 mg | INTRAMUSCULAR | Status: DC | PRN

## 2021-05-29 MED ORDER — MAGNESIUM SULFATE 40 GM/1000ML IV SOLN
INTRAVENOUS | Status: AC
  Administered 2021-05-29: 2 g/h via INTRAVENOUS
  Filled 2021-05-29: qty 1000

## 2021-05-29 MED ORDER — MORPHINE SULFATE (PF) 0.5 MG/ML IJ SOLN
INTRAMUSCULAR | Status: AC
  Filled 2021-05-29: qty 10

## 2021-05-29 MED ORDER — LACTATED RINGERS IV SOLN: INTRAVENOUS | Status: DC | PRN

## 2021-05-29 MED ORDER — ACETAMINOPHEN 500 MG PO TABS
1000.0000 mg | ORAL_TABLET | Freq: Four times a day (QID) | ORAL | Status: AC
Start: 2021-05-29 — End: 2021-05-30
  Administered 2021-05-29 – 2021-05-30 (×4): 1000 mg via ORAL
  Filled 2021-05-29 (×4): qty 2

## 2021-05-29 MED ORDER — MAGNESIUM SULFATE 40 GM/1000ML IV SOLN
2.0000 g/h | INTRAVENOUS | Status: DC
  Administered 2021-05-30: 2 g/h via INTRAVENOUS
  Filled 2021-05-29: qty 1000

## 2021-05-29 MED ORDER — BUPIVACAINE HCL (PF) 0.5 % IJ SOLN
INTRAMUSCULAR | Status: AC
  Filled 2021-05-29: qty 30

## 2021-05-29 MED ORDER — MORPHINE SULFATE (PF) 2 MG/ML IV SOLN
1.0000 mg | INTRAVENOUS | Status: DC | PRN
Start: 2021-05-29 — End: 2021-05-31

## 2021-05-29 MED ORDER — SOD CITRATE-CITRIC ACID 500-334 MG/5ML PO SOLN: 30.0000 mL | ORAL | Status: DC

## 2021-05-29 MED ORDER — NALOXONE HCL 0.4 MG/ML IJ SOLN: 0.4000 mg | INTRAMUSCULAR | Status: DC | PRN

## 2021-05-29 MED ORDER — MAGNESIUM SULFATE BOLUS VIA INFUSION
4.0000 g | Freq: Once | INTRAVENOUS | Status: AC
  Administered 2021-05-29: 4 g via INTRAVENOUS
  Filled 2021-05-29: qty 1000

## 2021-05-29 MED ORDER — DIPHENHYDRAMINE HCL 50 MG/ML IJ SOLN: 12.5000 mg | INTRAMUSCULAR | Status: DC | PRN

## 2021-05-29 MED ORDER — MORPHINE SULFATE (PF) 0.5 MG/ML IJ SOLN
INTRAMUSCULAR | Status: DC | PRN
  Administered 2021-05-29: .1 mg via INTRATHECAL

## 2021-05-29 MED ORDER — DIPHENHYDRAMINE HCL 25 MG PO CAPS: 25.0000 mg | ORAL_CAPSULE | ORAL | Status: DC | PRN

## 2021-05-29 MED ORDER — FENTANYL CITRATE (PF) 100 MCG/2ML IJ SOLN
INTRAMUSCULAR | Status: AC
  Filled 2021-05-29: qty 2

## 2021-05-29 MED ORDER — BUPIVACAINE HCL (PF) 0.5 % IJ SOLN
INTRAMUSCULAR | Status: DC | PRN
  Administered 2021-05-29: 30 mL

## 2021-05-29 MED ORDER — BUPIVACAINE 0.25 % ON-Q PUMP DUAL CATH 400 ML
400.0000 mL | INJECTION | Status: DC
  Filled 2021-05-29: qty 400

## 2021-05-29 MED ORDER — WITCH HAZEL-GLYCERIN EX PADS: 1.0000 "application " | MEDICATED_PAD | CUTANEOUS | Status: DC | PRN

## 2021-05-29 MED ORDER — LABETALOL HCL 5 MG/ML IV SOLN: 20.0000 mg | INTRAVENOUS | Status: DC | PRN

## 2021-05-29 MED ORDER — KETOROLAC TROMETHAMINE 30 MG/ML IJ SOLN
INTRAMUSCULAR | Status: AC
  Filled 2021-05-29: qty 1

## 2021-05-29 MED ORDER — LACTATED RINGERS IV SOLN: INTRAVENOUS | Status: DC

## 2021-05-29 MED ORDER — ONDANSETRON HCL 4 MG/2ML IJ SOLN
INTRAMUSCULAR | Status: DC | PRN
  Administered 2021-05-29: 4 mg via INTRAVENOUS

## 2021-05-29 MED ORDER — LABETALOL HCL 5 MG/ML IV SOLN: 40.0000 mg | INTRAVENOUS | Status: DC | PRN

## 2021-05-29 MED ORDER — COCONUT OIL OIL
1.0000 "application " | TOPICAL_OIL | Status: DC | PRN
  Administered 2021-05-31: 1 via TOPICAL
  Filled 2021-05-29 (×2): qty 120

## 2021-05-29 MED ORDER — ENOXAPARIN SODIUM 40 MG/0.4ML IJ SOSY
40.0000 mg | PREFILLED_SYRINGE | INTRAMUSCULAR | Status: DC
  Administered 2021-05-30: 40 mg via SUBCUTANEOUS
  Filled 2021-05-29 (×2): qty 0.4

## 2021-05-29 MED ORDER — NALOXONE HCL 4 MG/10ML IJ SOLN
1.0000 ug/kg/h | INTRAVENOUS | Status: DC | PRN
  Filled 2021-05-29: qty 5

## 2021-05-29 MED ORDER — ONDANSETRON HCL 4 MG/2ML IJ SOLN: 4.0000 mg | Freq: Three times a day (TID) | INTRAMUSCULAR | Status: DC | PRN

## 2021-05-29 MED ORDER — SIMETHICONE 80 MG PO CHEW
80.0000 mg | CHEWABLE_TABLET | ORAL | Status: DC | PRN
  Administered 2021-05-30: 80 mg via ORAL
  Filled 2021-05-29 (×2): qty 1

## 2021-05-29 MED ORDER — LABETALOL HCL 5 MG/ML IV SOLN: 80.0000 mg | INTRAVENOUS | Status: DC | PRN

## 2021-05-29 MED ORDER — SOD CITRATE-CITRIC ACID 500-334 MG/5ML PO SOLN
ORAL | Status: AC
  Administered 2021-05-29: 30 mL via ORAL
  Filled 2021-05-29: qty 15

## 2021-05-29 MED ORDER — DIBUCAINE (PERIANAL) 1 % EX OINT: 1.0000 "application " | TOPICAL_OINTMENT | CUTANEOUS | Status: DC | PRN

## 2021-05-29 MED ORDER — CALCIUM GLUCONATE 10 % IV SOLN
INTRAVENOUS | Status: AC
  Filled 2021-05-29: qty 10

## 2021-05-29 SURGICAL SUPPLY — 28 items
BACTOSHIELD CHG 4% 4OZ (MISCELLANEOUS) ×1
CATH KIT ON-Q SILVERSOAK 5IN (CATHETERS) ×4 IMPLANT
CHLORAPREP W/TINT 26 (MISCELLANEOUS) ×4 IMPLANT
DERMABOND ADVANCED (GAUZE/BANDAGES/DRESSINGS) ×1
DERMABOND ADVANCED .7 DNX12 (GAUZE/BANDAGES/DRESSINGS) ×1 IMPLANT
DRESSING SURGICEL FIBRLLR 1X2 (HEMOSTASIS) ×1 IMPLANT
DRSG OPSITE POSTOP 4X10 (GAUZE/BANDAGES/DRESSINGS) ×2 IMPLANT
DRSG SURGICEL FIBRILLAR 1X2 (HEMOSTASIS) ×2
ELECT CAUTERY BLADE 6.4 (BLADE) ×2 IMPLANT
ELECT REM PT RETURN 9FT ADLT (ELECTROSURGICAL) ×2
ELECTRODE REM PT RTRN 9FT ADLT (ELECTROSURGICAL) ×1 IMPLANT
GLOVE SURG NEOPR MICRO LF SZ8 (GLOVE) ×2 IMPLANT
GOWN STRL REUS W/ TWL LRG LVL3 (GOWN DISPOSABLE) ×1 IMPLANT
GOWN STRL REUS W/ TWL XL LVL3 (GOWN DISPOSABLE) ×2 IMPLANT
GOWN STRL REUS W/TWL LRG LVL3 (GOWN DISPOSABLE) ×1
GOWN STRL REUS W/TWL XL LVL3 (GOWN DISPOSABLE) ×2
MANIFOLD NEPTUNE II (INSTRUMENTS) ×2 IMPLANT
MAT PREVALON FULL STRYKER (MISCELLANEOUS) ×2 IMPLANT
NS IRRIG 1000ML POUR BTL (IV SOLUTION) ×2 IMPLANT
PACK C SECTION AR (MISCELLANEOUS) ×2 IMPLANT
PAD OB MATERNITY 4.3X12.25 (PERSONAL CARE ITEMS) ×2 IMPLANT
PAD PREP 24X41 OB/GYN DISP (PERSONAL CARE ITEMS) ×2 IMPLANT
PENCIL SMOKE EVACUATOR (MISCELLANEOUS) ×2 IMPLANT
SCRUB CHG 4% DYNA-HEX 4OZ (MISCELLANEOUS) ×1 IMPLANT
SUT MAXON ABS #0 GS21 30IN (SUTURE) ×4 IMPLANT
SUT VIC AB 1 CT1 36 (SUTURE) ×8 IMPLANT
SUT VIC AB 2-0 CT1 36 (SUTURE) ×2 IMPLANT
SUT VIC AB 4-0 FS2 27 (SUTURE) ×2 IMPLANT

## 2021-05-29 NOTE — Op Note (Signed)
Cesarean Section Procedure Note Indications: severe preeclampsia and prior cesarean section, Desire for permanent sterility  Pre-operative Diagnosis: Intrauterine pregnancy [redacted]w[redacted]d ;  severe preeclampsia, 34 weeks, and prior cesarean section, Desire for permanent sterility Post-operative Diagnosis: same, delivered. Procedure: Low Transverse Cesarean Section, Bilateral Tubal Ligation Surgeon: Annamarie Major, MD Assistant(s): Liana Crocker, No other capable assistant available, in surgery requiring high level assistant. Anesthesia: Spinal anesthesia Estimated Blood Loss:400 Complications: None; patient tolerated the procedure well. Disposition: PACU - hemodynamically stable. Condition: stable  Findings: A female infant in the cephalic presentation. Amniotic fluid - Clear  Birth weight pending.  Apgars of 8 and 9.  Intact placenta with a three-vessel cord. Grossly normal uterus, tubes and ovaries bilaterally. Anterior abdominal wall to uterus intraabdominal adhesions were noted.  Procedure Details   The patient was taken to Operating Room, identified as the correct patient and the procedure verified as C-Section Delivery. A Time Out was held and the above information confirmed. After induction of anesthesia, the patient was draped and prepped in the usual sterile manner. A Pfannenstiel incision was made and carried down through the subcutaneous tissue to the fascia. Fascial incision was made and extended transversely with the Mayo scissors. The fascia was separated from the underlying rectus tissue superiorly and inferiorly. The peritoneum was identified and entered bluntly. Peritoneal incision was extended longitudinally. The utero-vesical peritoneal reflection was incised transversely and a bladder flap was created digitally.  A low transverse hysterotomy was made. The fetus was delivered atraumatically. The umbilical cord was clamped x2 and cut and the infant was handed to the awaiting pediatricians. The  placenta was removed intact and appeared normal with a 3-vessel cord.  The uterus was exteriorized and cleared of all clot and debris. The hysterotomy was closed with running sutures of 0 Vicryl suture. A second imbricating layer was placed with the same suture. Excellent hemostasis was observed.   The left Fallopian tube was identified, grasped with the Babcock clamps, lifted to the skin incision and followed out distally to the fimbriae. An avascular midsection of the tube approximately 3-4cm from the cornua was grasped with the babcock clamps and brought into a knuckle at the skin incision. The tube was double ligated with 2-0 Vicryl suture and the intervening portion of tube was transected and removed. Excellent hemostasis was noted and the tube was returned to the abdomen. Attention was then turned to the right fallopian tube after confirmation of identification by tracing the tube out to the fimbriae. The same procedure was then performed on the right Fallopian tube. Again, excellent hemostasis was noted at the end of the procedure.  The uterus was returned to the abdomen. The pelvis was irrigated and again, excellent hemostasis was noted.  The On Q Pain pump System was then placed.  Trocars were placed through the abdominal wall into the subfascial space and these were used to thread the silver soaker cathaters into place.The rectus fascia was then reapproximated with running sutures of Maxon, with careful placement not to incorporate the cathaters. Subcutaneous tissues are then irrigated with saline and hemostasis assured.  Skin is then closed with 4-0 vicryl suture in a subcuticular fashion followed by skin adhesive.  The surgical assistant performed tissue retraction, assistance with suturing, and fundal pressure.   The cathaters are flushed each with 5 mL of Bupivicaine and stabilized into place with dressing. Instrument, sponge, and needle counts were correct prior to the abdominal closure and at  the conclusion of the case.  The patient tolerated  the procedure well and was transferred to the recovery room in stable condition.   Annamarie Major, MD, [facility], [facility] 05/29/2021  1:27 PM

## 2021-05-29 NOTE — Progress Notes (Signed)
RN notified Tiburcio Pea, MD regarding pt's low urinary output since 1500. No new orders from provider at this moment. Will continue to monitor strict I/O.

## 2021-05-29 NOTE — Transfer of Care (Signed)
Immediate Anesthesia Transfer of Care Note  Patient: Sandra Maynard  Procedure(s) Performed: CESAREAN SECTION WITH BILATERAL TUBAL LIGATION (Bilateral)  Patient Location: PACU  Anesthesia Type:Spinal  Level of Consciousness: awake, alert  and oriented  Airway & Oxygen Therapy: Patient Spontanous Breathing  Post-op Assessment: Report given to RN and Post -op Vital signs reviewed and stable  Post vital signs: Reviewed and stable  Last Vitals:  Vitals Value Taken Time  BP 110/70 05/29/21 1328  Temp 36.4 C 05/29/21 1322  Pulse 65 05/29/21 1328  Resp 6 05/29/21 1328  SpO2 96 % 05/29/21 1328  Vitals shown include unvalidated device data.  Last Pain:  Vitals:   05/29/21 1328  TempSrc:   PainSc: 0-No pain         Complications: No notable events documented.

## 2021-05-29 NOTE — Anesthesia Postprocedure Evaluation (Signed)
Anesthesia Post Note  Patient: Sandra Maynard  Procedure(s) Performed: CESAREAN SECTION WITH BILATERAL TUBAL LIGATION (Bilateral)  Patient location during evaluation: PACU Anesthesia Type: Spinal Level of consciousness: awake and alert, awake and oriented Pain management: pain level controlled Vital Signs Assessment: post-procedure vital signs reviewed and stable Respiratory status: spontaneous breathing, nonlabored ventilation and respiratory function stable Cardiovascular status: blood pressure returned to baseline and stable Postop Assessment: no apparent nausea or vomiting Anesthetic complications: no   No notable events documented.   Last Vitals:  Vitals:   05/29/21 1421 05/29/21 1422  BP:    Pulse: 63 60  Resp: 17 13  Temp:    SpO2: 97% 99%    Last Pain:  Vitals:   05/29/21 1328  TempSrc:   PainSc: 0-No pain                 Manfred Arch

## 2021-05-29 NOTE — Progress Notes (Signed)
Admit Date: 05/24/2021 Today's Date: 05/29/2021  Subjective: Postpartum Day 0: Cesarean Delivery Patient reports tolerating PO.  No pain. Denies headache.  Reports feet swelling.  Objective: Vital signs in last 24 hours: Temp:  [97.5 F (36.4 C)-98.7 F (37.1 C)] 97.8 F (36.6 C) (08/17 1500) Pulse Rate:  [58-84] 80 (08/17 1703) Resp:  [6-25] 12 (08/17 1703) BP: (102-157)/(69-103) 127/87 (08/17 1703) SpO2:  [91 %-100 %] 98 % (08/17 1700) Weight:  [85.6 kg] 85.6 kg (08/16 1935)  Physical Exam:  General: alert, cooperative, and no distress Lochia: appropriate Uterine Fundus: firm Incision: healing well, no significant drainage, no dehiscence, no significant erythema DVT Evaluation: No evidence of DVT seen on physical exam. Negative Homan's sign. No cords or calf tenderness.. Calf/Ankle edema is present 1+  Recent Labs    05/27/21 0427 05/29/21 0211  HGB 11.4* 12.4  HCT 34.7* 36.5    Assessment/Plan: Status post Cesarean section. Doing well postoperatively. Preeclampsia, cont magnesium for 24 hours; cont Lovenox for DVT prophylaxis for 3-6 weeks.   Continue current care. Monitor UOP.  Letitia Libra 05/29/2021, 5:12 PM

## 2021-05-29 NOTE — Progress Notes (Signed)
Hypoglycemic Event  CBG: 66  Treatment: 8 oz juice/soda  Symptoms: None  Follow-up CBG: Time:1435 CBG Result:74  Possible Reasons for Event: Other: Pt was in the operating room c/s with BTL  Comments/MD notified:M.Eunice Blase, CNM notified   Caren Hazy

## 2021-05-29 NOTE — Progress Notes (Signed)
History and Physical Interval Note:  05/29/2021 7:32 AM  Sandra Maynard  has presented today now that she is 34 weeks (has been inpatient for 6 days) for surgery, with the diagnosis of severe preeclampsia, history of cesarean, and desire for sterilization.  The various methods of treatment have been discussed with the patient and family (interpreter utilized). After consideration of risks, benefits and other options for treatment, the patient has consented to  Procedure(s): CESAREAN SECTION WITH BILATERAL TUBAL LIGATION (Bilateral) as a surgical intervention .  The patient's history has been reviewed, patient examined, no change in status, stable for surgery.   I have reviewed the patient's chart and labs.  Questions were answered to the patient's satisfaction.    The risks of cesarean section discussed with the patient included but were not limited to: bleeding which may require transfusion or reoperation; infection which may require antibiotics; injury to bowel, bladder, ureters or other surrounding organs; injury to the fetus; need for additional procedures including hysterectomy in the event of a life-threatening hemorrhage; placental abnormalities wth subsequent pregnancies, incisional problems, thromboembolic phenomenon and other postoperative/anesthesia complications. The patient concurred with the proposed plan, giving informed written consent for the procedure.   Patient has been counseled as to the risks of prematurity, including risks of respiratory depression or distress, jaundice, feeding or temperature regulation problems, neurologic concerns including hearing or visual problems, and brain complications. Neonatology has consulted with the patient.  Annamarie Major, MD, [facility], [facility] 05/29/2021  7:32 AM

## 2021-05-29 NOTE — Anesthesia Procedure Notes (Signed)
Spinal  Patient location during procedure: OR Start time: 05/29/2021 11:54 AM End time: 05/29/2021 11:56 AM Reason for block: surgical anesthesia Staffing Performed: resident/CRNA  Anesthesiologist: Phill Mutter, MD Resident/CRNA: Hedda Slade, CRNA Preanesthetic Checklist Completed: patient identified, IV checked, site marked, risks and benefits discussed, surgical consent, monitors and equipment checked, pre-op evaluation and timeout performed Spinal Block Patient position: sitting Prep: ChloraPrep Patient monitoring: heart rate, continuous pulse ox, blood pressure and cardiac monitor Approach: midline Location: L3-4 Injection technique: single-shot Needle Needle type: Whitacre and Introducer  Needle gauge: 24 G Needle length: 9 cm Assessment Events: CSF return Additional Notes Negative paresthesia. Negative blood return. Positive free-flowing CSF. Expiration date of kit checked and confirmed. Patient tolerated procedure well, without complications.

## 2021-05-30 ENCOUNTER — Ambulatory Visit: Payer: Self-pay

## 2021-05-30 ENCOUNTER — Encounter: Payer: Self-pay | Admitting: Obstetrics & Gynecology

## 2021-05-30 DIAGNOSIS — O24434 Gestational diabetes mellitus in the puerperium, insulin controlled: Secondary | ICD-10-CM

## 2021-05-30 DIAGNOSIS — O1495 Unspecified pre-eclampsia, complicating the puerperium: Secondary | ICD-10-CM

## 2021-05-30 LAB — CBC
HCT: 30.7 % — ABNORMAL LOW (ref 36.0–46.0)
Hemoglobin: 10.4 g/dL — ABNORMAL LOW (ref 12.0–15.0)
MCH: 28.8 pg (ref 26.0–34.0)
MCHC: 33.9 g/dL (ref 30.0–36.0)
MCV: 85 fL (ref 80.0–100.0)
Platelets: 198 10*3/uL (ref 150–400)
RBC: 3.61 MIL/uL — ABNORMAL LOW (ref 3.87–5.11)
RDW: 13.1 % (ref 11.5–15.5)
WBC: 9.5 10*3/uL (ref 4.0–10.5)
nRBC: 0 % (ref 0.0–0.2)

## 2021-05-30 LAB — CREATININE, SERUM
Creatinine, Ser: 0.62 mg/dL (ref 0.44–1.00)
GFR, Estimated: >60 mL/min (ref >60)

## 2021-05-30 LAB — SURGICAL PATHOLOGY

## 2021-05-30 MED ORDER — IBUPROFEN 600 MG PO TABS
600.0000 mg | ORAL_TABLET | Freq: Four times a day (QID) | ORAL | Status: DC
  Administered 2021-05-30 – 2021-05-31 (×3): 600 mg via ORAL
  Filled 2021-05-30 (×2): qty 1

## 2021-05-30 MED ORDER — METOCLOPRAMIDE HCL 5 MG PO TABS
5.0000 mg | ORAL_TABLET | Freq: Three times a day (TID) | ORAL | Status: DC
  Administered 2021-05-31: 5 mg via ORAL
  Filled 2021-05-30 (×5): qty 1

## 2021-05-30 NOTE — Progress Notes (Signed)
Paged provider to notify of assessment finding. Pt is not in distress, bleeding is WNL, pain is well controlled. Dr Jerene Pitch suggested simethicone and will order Reglan to aid with gas build-up.

## 2021-05-30 NOTE — Progress Notes (Signed)
Subjective:   She is feeling well. She is resting in bed.   Objective:  Blood pressure 129/86, pulse 80, temperature 98.7 F (37.1 C), temperature source Oral, resp. rate 16, height 5' (1.524 m), weight 85.6 kg, last menstrual period 09/01/2020, SpO2 95 %, unknown if currently breastfeeding.  General: NAD Pulmonary: no increased work of breathing Abdomen: non-distended, non-tender, fundus firm at level of umbilicus Incision: clean, dry and intact. Extremities: no edema, no erythema, no tenderness  Results for orders placed or performed during the hospital encounter of 05/24/21 (from the past 72 hour(s))  Glucose, capillary     Status: Abnormal   Collection Time: 05/27/21 11:00 AM  Result Value Ref Range   Glucose-Capillary 124 (H) 70 - 99 mg/dL    Comment: Glucose reference range applies only to samples taken after fasting for at least 8 hours.   Comment 1 Notify RN   Glucose, capillary     Status: None   Collection Time: 05/27/21  2:38 PM  Result Value Ref Range   Glucose-Capillary 79 70 - 99 mg/dL    Comment: Glucose reference range applies only to samples taken after fasting for at least 8 hours.  Glucose, capillary     Status: None   Collection Time: 05/27/21  7:21 PM  Result Value Ref Range   Glucose-Capillary 97 70 - 99 mg/dL    Comment: Glucose reference range applies only to samples taken after fasting for at least 8 hours.  Glucose, capillary     Status: None   Collection Time: 05/27/21 11:21 PM  Result Value Ref Range   Glucose-Capillary 76 70 - 99 mg/dL    Comment: Glucose reference range applies only to samples taken after fasting for at least 8 hours.  Glucose, capillary     Status: Abnormal   Collection Time: 05/28/21  8:04 AM  Result Value Ref Range   Glucose-Capillary 102 (H) 70 - 99 mg/dL    Comment: Glucose reference range applies only to samples taken after fasting for at least 8 hours.  Glucose, capillary     Status: Abnormal   Collection Time: 05/28/21  10:57 AM  Result Value Ref Range   Glucose-Capillary 118 (H) 70 - 99 mg/dL    Comment: Glucose reference range applies only to samples taken after fasting for at least 8 hours.  Glucose, capillary     Status: None   Collection Time: 05/28/21  3:22 PM  Result Value Ref Range   Glucose-Capillary 87 70 - 99 mg/dL    Comment: Glucose reference range applies only to samples taken after fasting for at least 8 hours.  Glucose, capillary     Status: Abnormal   Collection Time: 05/28/21  8:01 PM  Result Value Ref Range   Glucose-Capillary 123 (H) 70 - 99 mg/dL    Comment: Glucose reference range applies only to samples taken after fasting for at least 8 hours.  Glucose, capillary     Status: None   Collection Time: 05/28/21 11:08 PM  Result Value Ref Range   Glucose-Capillary 78 70 - 99 mg/dL    Comment: Glucose reference range applies only to samples taken after fasting for at least 8 hours.  CBC     Status: None   Collection Time: 05/29/21  2:11 AM  Result Value Ref Range   WBC 9.0 4.0 - 10.5 K/uL   RBC 4.32 3.87 - 5.11 MIL/uL   Hemoglobin 12.4 12.0 - 15.0 g/dL   HCT 33.8 32.9 - 19.1 %  MCV 84.5 80.0 - 100.0 fL   MCH 28.7 26.0 - 34.0 pg   MCHC 34.0 30.0 - 36.0 g/dL   RDW 17.5 10.2 - 58.5 %   Platelets 218 150 - 400 K/uL   nRBC 0.0 0.0 - 0.2 %    Comment: Performed at [facility], [address] [place], [place] [address]  RPR     Status: None   Collection Time: 05/29/21  2:11 AM  Result Value Ref Range   RPR Ser Ql NON REACTIVE NON REACTIVE    Comment: Performed at [facility], [address] [place], [place] [address]  Rapid HIV screen (HIV 1/2 Ab+Ag)     Status: None   Collection Time: 05/29/21  2:11 AM  Result Value Ref Range   HIV-1 P24 Antigen - HIV24 NON REACTIVE NON REACTIVE    Comment: (NOTE) Detection of p24 may be inhibited by biotin in the sample, causing false negative results in acute infection.    HIV 1/2 Antibodies NON REACTIVE NON REACTIVE    Interpretation (HIV Ag Ab)      A non reactive test result means that HIV 1 or HIV 2 antibodies and HIV 1 p24 antigen were not detected in the specimen.    Comment: Performed at [facility], [address] [place], [place] [address]  Type and screen [facility]     Status: None   Collection Time: 05/29/21  2:11 AM  Result Value Ref Range   ABO/RH(D) O POS    Antibody Screen NEG    Sample Expiration      06/01/2021,2359 Performed at [facility], [address] [place], [place] [address]   Creatinine, serum     Status: None   Collection Time: 05/29/21  2:11 AM  Result Value Ref Range   Creatinine, Ser 0.47 0.44 - 1.00 mg/dL   GFR, Estimated >86 >76 mL/min    Comment: (NOTE) Calculated using the CKD-EPI Creatinine Equation (2021) Performed at [facility], [address] [place], [place] [address]   Glucose, capillary     Status: None   Collection Time: 05/29/21  8:04 AM  Result Value Ref Range   Glucose-Capillary 71 70 - 99 mg/dL    Comment: Glucose reference range applies only to samples taken after fasting for at least 8 hours.  Glucose, capillary     Status: None   Collection Time: 05/29/21 11:19 AM  Result Value Ref Range   Glucose-Capillary 74 70 - 99 mg/dL    Comment: Glucose reference range applies only to samples taken after fasting for at least 8 hours.  Glucose, capillary     Status: Abnormal   Collection Time: 05/29/21  1:54 PM  Result Value Ref Range   Glucose-Capillary 66 (L) 70 - 99 mg/dL    Comment: Glucose reference range applies only to samples taken after fasting for at least 8 hours.  Glucose, capillary     Status: None   Collection Time: 05/29/21  2:34 PM  Result Value Ref Range   Glucose-Capillary 73 70 - 99 mg/dL    Comment: Glucose reference range applies only to samples taken after fasting for at least 8 hours.  Creatinine, serum     Status: None   Collection Time: 05/30/21  5:37 AM  Result  Value Ref Range   Creatinine, Ser 0.62 0.44 - 1.00 mg/dL   GFR, Estimated >32 >67 mL/min    Comment: (NOTE) Calculated using the CKD-EPI  Creatinine Equation (2021) Performed at [facility], [address] [place], [place] [address]   CBC     Status: Abnormal   Collection Time: 05/30/21  5:37 AM  Result Value Ref Range   WBC 9.5 4.0 - 10.5 K/uL   RBC 3.61 (L) 3.87 - 5.11 MIL/uL   Hemoglobin 10.4 (L) 12.0 - 15.0 g/dL   HCT 60.4 (L) 54.0 - 98.1 %   MCV 85.0 80.0 - 100.0 fL   MCH 28.8 26.0 - 34.0 pg   MCHC 33.9 30.0 - 36.0 g/dL   RDW 19.1 47.8 - 29.5 %   Platelets 198 150 - 400 K/uL   nRBC 0.0 0.0 - 0.2 %    Comment: Performed at [facility], [address], [place], [place] [address]     Assessment:   35 y.o. [id number] postoperativeday # 1   Plan:  1) Acute blood loss anemia - hemodynamically stable and asymptomatic - po ferrous sulfate  2) Blood Type --/--/O POS (08/17 0211)   3) Rubella Immune / Varicella Immune  4) TDAP status - up to date Immunization History  Administered Date(s) Administered   Influenza,inj,Quad PF,6+ Mos 12/11/2020   PFIZER(Purple Top)SARS-COV-2 Vaccination 01/06/2020, 01/27/2020   Tdap 04/19/2021    5) Feeding- bottle, baby in NICU.  6) Contraception - tubal performed  7) Preeclampsia- continue Magnesium for 24 hours, BP controlled on Procardia 60 mg.  8) Gestational diabetes- blood glucose has been WNL.  9) Disposition - continue care  Adelene Idler MD, [facility], [facility] 05/30/2021 10:15 AM

## 2021-05-30 NOTE — Lactation Note (Signed)
This note was copied from a baby's chart. Lactation Consultation Note  Patient Name: Sandra Maynard STMHD'Q Date: 05/30/2021 Reason for consult: Initial assessment;Late-preterm 34-36.6wks;Infant < 6lbs;Other (Comment) (c-section) Age:33 hours  Lactation at mom's bedside in LDR6. Mom delivered 4th baby pre-term at 34 weeks via c-section due to severe pre-eclampsia. Mom on Mag2+ for 24 hrs. Mom desires breast and formula feeding. Lactation in room to set-up, educate, and assist with first pumping session. Interpreter services [phone] used to assist with education of pump set-up, use, cleaning, and transport of milk.  LC assisted mom with first pump session, drops of colostrum collected on breast flange and into yellow valve. Pump pieces taken to SCN for them to be swabbed and given to baby.  Mom directed to pump every 2-3 hours, with pump schedule placed on whiteboard in her room. Mom was encouraged to also set an alarm on her phone for next pumping time of 1330.   Mom voiced no questions/concerns with pumping, or re: continuation of pumping or cleaning of parts/pieces.  Mom encouraged to call out for help as needed.   Maternal Data Has patient been taught Hand Expression?: Yes Does the patient have breastfeeding experience prior to this delivery?: Yes How long did the patient breastfeed?: varying times (2 premature babies (25 weeks))  Feeding Mother's Current Feeding Choice: Breast Milk and Formula  LATCH Score                    Lactation Tools Discussed/Used Tools: Pump Breast pump type: Double-Electric Breast Pump Pump Education: Setup, frequency, and cleaning;Milk Storage Reason for Pumping: SCN; 34 weeks Pumping frequency: q 2-3 hours  Interventions Interventions: Breast feeding basics reviewed;Hand express;DEBP  Discharge    Consult Status Consult Status: Follow-up Date: 05/30/21 Follow-up type: Call as needed    Danford Bad 05/30/2021,  1:27 PM

## 2021-05-30 NOTE — Progress Notes (Signed)
Patient ambulated in room from bed to wheelchair. Patient tolerated well and stated that she did not feel lightheaded or dizzy, but only complained of feeling some pain at her incision site. RN assessed patient prior to going to SCN and lungs were clear, +1 reflexes, absent clonus and patient put on portable 02 monitors. Patient taken via wheelchair to SCN with father of baby to see baby. RN brought nursing pump with patient to SCN to pump. Maralyn Sago, lactation RN, called to assist patient with pumping in SCN and SCN RN aware of patient and father of baby in unit. MD aware and gives verbal order for patient to go to SCN.

## 2021-05-30 NOTE — Progress Notes (Signed)
RN called lactation to help patient with pumping. Lactation currently at bedside with interpreter.

## 2021-05-30 NOTE — Progress Notes (Signed)
RN used interpreter on hospial iPad to review the plan of care and plans for the day. Patient denied having any questions and appreciated being updated on the plan.

## 2021-05-31 ENCOUNTER — Ambulatory Visit: Payer: Self-pay

## 2021-05-31 DIAGNOSIS — O1495 Unspecified pre-eclampsia, complicating the puerperium: Secondary | ICD-10-CM

## 2021-05-31 DIAGNOSIS — O24434 Gestational diabetes mellitus in the puerperium, insulin controlled: Secondary | ICD-10-CM

## 2021-05-31 MED ORDER — ACETAMINOPHEN 500 MG PO TABS: 500.0000 mg | ORAL_TABLET | ORAL | Status: DC | PRN

## 2021-05-31 MED ORDER — NIFEDIPINE ER 60 MG PO TB24: 60.0000 mg | ORAL_TABLET | Freq: Every day | ORAL | 1 refills | Status: DC

## 2021-05-31 MED ORDER — OXYCODONE-ACETAMINOPHEN 5-325 MG PO TABS: 1.0000 | ORAL_TABLET | ORAL | 0 refills | Status: DC | PRN

## 2021-05-31 MED ORDER — IBUPROFEN 600 MG PO TABS: 600.0000 mg | ORAL_TABLET | Freq: Four times a day (QID) | ORAL | 0 refills | Status: DC | PRN

## 2021-05-31 NOTE — Progress Notes (Signed)
During assessment, noted that pt's abdomen appears very bloated, unable to definitively palpate fundus. Called SCC to assist, K. Cevallos at bedside, was unable to discern fundal position as well. Pt is well appearing and does not report changes in condition, bleeding is scant to small, VS WNL, denies pain or discomfort, reports she is passing "a little" gas. Will contact provider to notify of finding.

## 2021-05-31 NOTE — Discharge Summary (Signed)
Postpartum Discharge Summary     Patient Name: Sandra Maynard DOB: 1986/08/19 MRN: [id number]  Date of admission: 05/24/2021 Delivery date:05/29/2021  Delivering provider: Gae Dry  Date of discharge: 05/31/2021  Admitting diagnosis: Preeclampsia [O14.90] with severe features Intrauterine pregnancy: [redacted]w[redacted]d    Secondary diagnosis:  Principal Problem:   Preeclampsia--severe Active Problems:   History of cesarean section (flat/narrow AP pelvis per Dr. EAmalia Hailey   Gestational diabetes 04/26/21   Admission for sterilization 34 weeks  Additional problems: none    Discharge diagnosis: Preterm Pregnancy Delivered and CHTN with superimposed preeclampsia                                              Post partum procedures: tubal ligation during c-section Augmentation: N/A Complications: None  Hospital course: The patient was admitted with worsening blood pressure and worsening proteinuria. She was managed on nifedipine xl 60 mg daily with blood pressures still near the severe range. She had a consultation by MFM, who recommended delivery at 34 weeks. Given her history of c-section and desire for sterilization, she was taken to the operating room for repeat c-section and BTL, which occurred without difficulty. She was maintained on magnesium sulfate for 24 hours postpartum and continued her antihypertensive medication with a moderate improvement in her blood pressures. By POD#2 she was ambulating, tolerating PO, voiding without difficulty, and had good pain  control. She had no severe symptoms of preeclampsia and her blood pressures were slightly improved compared to during her hospitalization.  She desired discharge on POD#2 due to having children at home. Given the overall clinical picture, she was discharged with strong precautions about returning for headache, visual changes, RUQ pain, and worsening blood pressure.   Magnesium Sulfate received: Yes: Seizure prophylaxis BMZ  received: Yes Rhophylac:N/A MMR:No T-DaP:Given prenatally on 04/19/2021 Flu: yes, given 12/11/2020 Transfusion:No  Physical exam  Vitals:   05/30/21 1959 05/30/21 2338 05/31/21 0407 05/31/21 0821  BP: (!) 134/93 (!) 145/84 137/80 (!) 143/94  Pulse: 99 94 (!) 108 89  Resp: _0 Temp: 99 F (37.2 C) 98.8 F (37.1 C) 98.5 F (36.9 C) 98.3 F (36.8 C)  TempSrc: Oral Oral Oral Oral  SpO2: 98% 99% 96% 98%  Weight:      Height:       General: alert, cooperative, and no distress Lochia: appropriate Uterine Fundus: firm Incision: Healing well with no significant drainage, Dressing is clean, dry, and intact DVT Evaluation: No evidence of DVT seen on physical exam. No cords or calf tenderness. No significant calf/ankle edema. Labs: Lab Results  Component Value Date   WBC 9.5 05/30/2021   HGB 10.4 (L) 05/30/2021   HCT 30.7 (L) 05/30/2021   MCV 85.0 05/30/2021   PLT 198 05/30/2021   CMP Latest Ref Rng & Units 05/30/2021  Glucose 70 - 99 mg/dL -  BUN 6 - 20 mg/dL -  Creatinine 0.44 - 1.00 mg/dL 0.62  Sodium 135 - 145 mmol/L -  Potassium 3.5 - 5.1 mmol/L -  Chloride 98 - 111 mmol/L -  CO2 22 - 32 mmol/L -  Calcium 8.9 - 10.3 mg/dL -  Total Protein 6.5 - 8.1 g/dL -  Total Bilirubin 0.3 - 1.2 mg/dL -  Alkaline Phos 38 - 126 U/L -  AST 15 - 41 U/L -  ALT 0 -  44 U/L -   Edinburgh Score: Edinburgh Postnatal Depression Scale Screening Tool 05/30/2021  I have been able to laugh and see the funny side of things. 0  I have looked forward with enjoyment to things. 1  I have blamed myself unnecessarily when things went wrong. 0  I have been anxious or worried for no good reason. 0  I have felt scared or panicky for no good reason. 0  Things have been getting on top of me. 1  I have been so unhappy that I have had difficulty sleeping. 0  I have felt sad or miserable. 0  I have been so unhappy that I have been crying. 0  The thought of harming myself has occurred to me. 0   Edinburgh Postnatal Depression Scale Total 2      After visit meds:  Allergies as of 05/31/2021   No Known Allergies      Medication List     STOP taking these medications    aspirin 81 MG chewable tablet       TAKE these medications    acetaminophen 500 MG tablet Commonly known as: TYLENOL Take 1 tablet (500 mg total) by mouth every 4 (four) hours as needed for mild pain.   ibuprofen 600 MG tablet Commonly known as: ADVIL Take 1 tablet (600 mg total) by mouth every 6 (six) hours as needed for mild pain or cramping.   multivitamin-prenatal 27-0.8 MG Tabs tablet Take 1 tablet by mouth daily at 12 noon.   NIFEdipine 60 MG 24 hr tablet Commonly known as: ADALAT CC Take 1 tablet (60 mg total) by mouth daily.   oxyCODONE-acetaminophen 5-325 MG tablet Commonly known as: Percocet Take 1 tablet by mouth every 4 (four) hours as needed for severe pain.               Discharge Care Instructions  (From admission, onward)           Start     Ordered   05/31/21 0000  Discharge wound care:       Comments: Perform wound care instructions   05/31/21 1002             Discharge home in stable condition Infant Feeding: breast and formula Infant Disposition:NICU Discharge instruction: per After Visit Summary and Postpartum booklet. Activity: Advance as tolerated. Pelvic rest for 6 weeks.  Diet: routine diet Anticipated Birth Control: BTL done during c-section Postpartum Appointment:1 week Additional Postpartum F/U: Postpartum Depression checkup, Incision check 1 week, and BP check 1 week Future Appointments:No future appointments. Follow up Visit:  Follow-up Information     Gae Dry, MD. Schedule an appointment as soon as possible for a visit in 1 week(s).   Specialty: Obstetrics and Gynecology Why: For Post Op and Blood pressure check Contact information: [address] [place] [address] [phone]                 SIGNED:  Prentice Docker, MD, [facility], [facility] 05/31/2021 10:08 AM

## 2021-05-31 NOTE — Lactation Note (Signed)
This note was copied from a baby's chart. Lactation Consultation Note  Patient Name: Sandra Maynard Today's Date: 05/31/2021   Age:33 hours  Maternal Data  Mom pumping breasts q 3h, spanish interpreter utilized through interpreter tablet, mom is to be d/c'd home today and will need to pump breasts at home until baby is d/c'd, mom states she has an electric breast pump athome but doesn;t know name, she will use this pump at home and pump at baby's bedside when visiting baby, she has [facility] and if her pump at home isn;t efficient, she will contact [employer] to obtain one of their pumps. I faxed a referral to [facility]. [facility] for pump need.  Mom instructed to pump breasts q 3hr or at least 8x/24 hrs. She voiced understanding through spanish interpreter  Feeding    LATCH Score                    Lactation Tools Discussed/Used Tools: 46F feeding tube / Syringe  Interventions    Discharge    Consult Status      Dyann Kief 05/31/2021, 9:02 PM

## 2021-05-31 NOTE — Progress Notes (Signed)
Patient discharged home. FOB present at discharge. Infant remains as a patient in SCN. Discharge instructions and prescriptions given and reviewed with patient. Patient verbalized understanding.   SCN phone number given and explained mother/father can call/visit 24/7.   Importance of checking BP at least 2x/day explained and BP medication explained in detail.   Incision care went over. Extra honeycomb dressing given to patient.   On-Q pump removed prior to discharge per patient request.   Follow-up appointment attempted to be scheduled by nurse but [facility] stated they would call pt beginning of next week to do so.   Escorted out by volunteers.

## 2021-05-31 NOTE — Discharge Instructions (Addendum)
Discharge Instructions:   Follow-up Appointment: Consuella Lose will call for an appointment in 1-week for a visit for a BP check and incision check!  If there are any new medications, they have been ordered and will be available for pickup at the listed pharmacy on your way home from the hospital.   Call office if you have any of the following: headache, visual changes, fever >101.0 F, chills, shortness of breath, breast concerns, excessive vaginal bleeding, incision drainage or problems, leg pain or redness, depression or any other concerns. If you have vaginal discharge with an odor, let your doctor know.   It is normal to bleed for up to 6 weeks. You should not soak through more than 1 pad in 1 hour. If you have a blood clot larger than your fist with continued bleeding, call your doctor.   After a c-section, you should expect a small amount of blood or clear fluid coming from the incision and abdominal cramping/soreness. Inspect your incision site daily. Stand in front of a mirror to look for any redness, incision opening, or discolored/odorness drainage. Take a shower daily and continue good hygiene. Use own towel and washcloth (do not share). Make sure your sheets on your bed are clean. No pets sleeping around your incision site. Dressing will be removed at your postpartum visit. If the dressing does become wet or soiled underneath, it is okay to remove it.   On-Q pump: You will remove on day 5 after insertion or if the ball becomes flat before day 5. You will remove on: Feb 13, 2019  Activity: Do not lift > 10 lbs for 6 weeks (do not lift anything heavier than your baby). No intercourse, tampons, swimming pools, hot tubs, baths (only showers) for 6 weeks.  No driving for 1-2 weeks. Continue prenatal vitamin, especially if breastfeeding. Increase calories and fluids (water) while breastfeeding.   Your milk will come in, in the next couple of days (right now it is colostrum). You may have a  slight fever when your milk comes in, but it should go away on its own.  If it does not, and rises above 101 F please call the doctor. You will also feel achy and your breasts will be firm. They will also start to leak. If you are breastfeeding, continue as you have been and you can pump/express milk for comfort.   If you have too much milk, your breasts can become engorged, which could lead to mastitis. This is an infection of the milk ducts. It can be very painful and you will need to notify your doctor to obtain a prescription for antibiotics. You can also treat it with a shower or hot/cold compress.   For concerns about your baby, please call your pediatrician.  For breastfeeding concerns, the lactation consultant can be reached at [phone].   Postpartum blues (feelings of happy one minute and sad another minute) are normal for the first few weeks but if it gets worse let your doctor know.   Congratulations! We enjoyed caring for you and your new bundle of joy!

## 2021-06-03 NOTE — Addendum Note (Signed)
Addended by: Heywood Bene on: 06/03/2021 12:27 PM   Modules accepted: Orders

## 2021-06-07 ENCOUNTER — Ambulatory Visit (INDEPENDENT_AMBULATORY_CARE_PROVIDER_SITE_OTHER): Payer: Self-pay | Admitting: Obstetrics & Gynecology

## 2021-06-07 ENCOUNTER — Other Ambulatory Visit: Payer: Self-pay

## 2021-06-07 ENCOUNTER — Encounter: Payer: Self-pay | Admitting: Obstetrics & Gynecology

## 2021-06-07 VITALS — BP 140/90 | Ht 60.0 in | Wt 171.0 lb

## 2021-06-07 DIAGNOSIS — O99213 Obesity complicating pregnancy, third trimester: Secondary | ICD-10-CM

## 2021-06-07 DIAGNOSIS — O165 Unspecified maternal hypertension, complicating the puerperium: Secondary | ICD-10-CM

## 2021-06-07 DIAGNOSIS — O1490 Unspecified pre-eclampsia, unspecified trimester: Secondary | ICD-10-CM

## 2021-06-07 DIAGNOSIS — Z98891 History of uterine scar from previous surgery: Secondary | ICD-10-CM

## 2021-06-07 MED ORDER — OXYCODONE-ACETAMINOPHEN 5-325 MG PO TABS: 1.0000 | ORAL_TABLET | ORAL | 0 refills | Status: DC | PRN

## 2021-06-07 NOTE — Progress Notes (Signed)
  Postoperative Follow-up Patient presents post op from recent Cesarean Section performed for Elective repeat and Preeclampsia (so done at 34 weeks) , also had BTL for sterilization,  1 week ago.   Subjective: Patient reports some improvement in her immediate post op symptoms. She declines headache, blurry vision, CP, SOB, epigastric pain, or edema. Eating a regular diet without difficulty. Pain is controlled with current analgesics. Medications being used: ibuprofen (OTC) and narcotic analgesics including Percocet.  Activity: sedentary. Patient reports additional symptom's since surgery of appropriate lochia, no signs of depression, and no signs of mastitis.  Infant in SCN.  Pt is breast pumping.  Objective: BP 140/90   Ht 5' (1.524 m)   Wt 171 lb (77.6 kg)   BMI 33.40 kg/m  Physical Exam Constitutional:      General: She is not in acute distress.    Appearance: She is well-developed.  Cardiovascular:     Rate and Rhythm: Normal rate.  Pulmonary:     Effort: Pulmonary effort is normal.  Abdominal:     General: There is no distension.     Palpations: Abdomen is soft.     Tenderness: There is no abdominal tenderness.     Comments: Incision Healing Well   Musculoskeletal:        General: Normal range of motion.  Neurological:     Mental Status: She is alert and oriented to person, place, and time.     Cranial Nerves: No cranial nerve deficit.  Skin:    General: Skin is warm and dry.    Assessment:    ICD-10-CM   1. S/P cesarean section  Z98.891     2. Pre-eclampsia, antepartum  O14.90     3. Hypertension, postpartum condition or complication  O16.5     4. Obesity affecting pregnancy in third trimester  O99.213 oxyCODONE-acetaminophen (PERCOCET) 5-325 MG tablet     Plan: Patient has done well after her Cesarean Section with no apparent complications.  I have discussed the post-operative course to date, and the expected progress moving forward.  The patient understands  what complications to be concerned about.  I will see the patient in routine follow up, or sooner if needed.    Activity plan: No heavy lifting.Marland Kitchen  Pelvic rest. She has had BTL for postpartum contraception. Infant doing well in SCN No s/sx PPD.  Sandra Maynard today is 0.  Cont Procardia.  BP ck 2 weeks.  Monitor for signs of worsening HTN.  Sandra Maynard 06/07/2021, 9:04 AM

## 2021-06-10 ENCOUNTER — Ambulatory Visit: Payer: Self-pay

## 2021-06-10 NOTE — Lactation Note (Signed)
This note was copied from a baby's chart. Lactation Consultation Note  Patient Name: Sandra Maynard JGGEZ'M Date: 06/10/2021   Age:33 days  Lactation follow-up at bedside. Mom has baby at R breast in cradle hold. Baby appears content, latched well, with visible jaw movement.  Mom reported that she is only pumping about 3x every 24 hours, currently volumes remain high, however, both SCN RN, and LC have educated mom and dad on milk supply and demand and potential for losing current supply over time. Encouraged a minimum of 8 pumping sessions per 24 hours.  No other concerns voiced.  Maternal Data    Feeding    LATCH Score Latch: Grasps breast easily, tongue down, lips flanged, rhythmical sucking.  Audible Swallowing: Spontaneous and intermittent  Type of Nipple: Everted at rest and after stimulation  Comfort (Breast/Nipple): Soft / non-tender  Hold (Positioning): Assistance needed to correctly position infant at breast and maintain latch.  LATCH Score: 9   Lactation Tools Discussed/Used Tools: 57F feeding tube / Syringe  Interventions Interventions: Breast feeding basics reviewed;Assisted with latch;Skin to skin;Breast compression;Support pillows;Education  Discharge    Consult Status      Sandra Maynard 06/10/2021, 9:48 AM

## 2021-06-20 ENCOUNTER — Ambulatory Visit: Payer: Self-pay

## 2021-06-21 ENCOUNTER — Ambulatory Visit (INDEPENDENT_AMBULATORY_CARE_PROVIDER_SITE_OTHER): Payer: Self-pay | Admitting: Obstetrics and Gynecology

## 2021-06-21 ENCOUNTER — Other Ambulatory Visit: Payer: Self-pay

## 2021-06-21 ENCOUNTER — Encounter: Payer: Self-pay | Admitting: Obstetrics and Gynecology

## 2021-06-21 VITALS — BP 132/74 | Ht 60.0 in | Wt 167.6 lb

## 2021-06-21 DIAGNOSIS — Z98891 History of uterine scar from previous surgery: Secondary | ICD-10-CM

## 2021-06-21 DIAGNOSIS — Z4889 Encounter for other specified surgical aftercare: Secondary | ICD-10-CM

## 2021-06-21 NOTE — Progress Notes (Signed)
Postpartum Visit  Chief Complaint:  Chief Complaint  Patient presents with   Postpartum Care    History of Present Illness: Patient is a 35 y.o. [id number] presents for postpartum visit.  Date of delivery: 05/29/2021 Type of delivery: Cesarean section  Pregnancy or labor problems: GDM and preeclampsia  Breast Feeding:  breast and bottle Lochia: less flow than a normal period Post partum depression/anxiety noted:  no Edinburgh Post-Partum Depression Score: 1  Date of last PAP: 2022 NIL   Any problems since the delivery:  Denies  She reports she has been feeling well, having normal bowel movement and urination. She is taking Procardia daily.   Newborn Details:  SINGLETON :  1. Baby Gender:female.  Infant Status: Infant doing well at home with mother.   Review of Systems: Review of Systems  Constitutional:  Negative for chills, fever, malaise/fatigue and weight loss.  HENT:  Negative for congestion, hearing loss and sinus pain.   Eyes:  Negative for blurred vision and double vision.  Respiratory:  Negative for cough, sputum production, shortness of breath and wheezing.   Cardiovascular:  Negative for chest pain, palpitations, orthopnea and leg swelling.  Gastrointestinal:  Negative for abdominal pain, constipation, diarrhea, nausea and vomiting.  Genitourinary:  Negative for dysuria, flank pain, frequency, hematuria and urgency.  Musculoskeletal:  Negative for back pain, falls and joint pain.  Skin:  Negative for itching and rash.  Neurological:  Negative for dizziness and headaches.  Psychiatric/Behavioral:  Negative for depression, substance abuse and suicidal ideas. The patient is not nervous/anxious.    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   Gestational diabetes 04/26/21 04/29/2021   Migraine    Preeclampsia 05/24/2021    Past Surgical History:  Past Surgical History:  Procedure  Laterality Date   CESAREAN SECTION  2012   CESAREAN SECTION WITH BILATERAL TUBAL LIGATION Bilateral 05/29/2021   Procedure: CESAREAN SECTION WITH BILATERAL TUBAL LIGATION;  Surgeon: Nadara Mustard, MD;  Location: [facility] ORS;  Service: Obstetrics;  Laterality: Bilateral;    Family History:  Family History  Problem Relation Age of Onset   Hypertension Mother     Social History:  Social History   Socioeconomic History   Marital status: Married    Spouse name: Dominga Ferry   Number of children: 3   Years of education: 6   Highest education level: Not on file  Occupational History   Not on file  Tobacco Use   Smoking status: Never   Smokeless tobacco: Never   Tobacco comments:    Denies secondhand smoke exposure  Vaping Use   Vaping Use: Never used  Substance and Sexual Activity   Alcohol use: Not Currently   Drug use: Never   Sexual activity: Yes    Partners: Male  Other Topics Concern   Not on file  Social History Narrative   Not on file   Social Determinants of Health   Financial Resource Strain: Not on file  Food Insecurity: No Food Insecurity   Worried About Running Out of Food in the Last Year: Never true   Ran Out of Food in the Last Year: Never true  Transportation Needs: No Transportation Needs   Lack of Transportation (Medical): No   Lack of Transportation (Non-Medical): No  Physical Activity: Not on file  Stress: Not on file  Social Connections: Not on file  Intimate Partner Violence: Not At Risk  Fear of Current or Ex-Partner: No   Emotionally Abused: No   Physically Abused: No   Sexually Abused: No    Allergies:  No Known Allergies  Medications: Prior to Admission medications   Medication Sig Start Date End Date Taking? Authorizing Provider  acetaminophen (TYLENOL) 500 MG tablet Take 1 tablet (500 mg total) by mouth every 4 (four) hours as needed for mild pain. 05/31/21  Yes Conard Novak, MD  ibuprofen (ADVIL) 600 MG tablet Take 1 tablet (600  mg total) by mouth every 6 (six) hours as needed for mild pain or cramping. 05/31/21  Yes Conard Novak, MD  NIFEdipine (ADALAT CC) 60 MG 24 hr tablet Take 1 tablet (60 mg total) by mouth daily. 05/31/21  Yes Conard Novak, MD  oxyCODONE-acetaminophen (PERCOCET) 5-325 MG tablet Take 1 tablet by mouth every 4 (four) hours as needed for severe pain. 06/07/21 06/07/22 Yes Nadara Mustard, MD  Prenatal Vit-Fe Fumarate-FA (MULTIVITAMIN-PRENATAL) 27-0.8 MG TABS tablet Take 1 tablet by mouth daily at 12 noon.   Yes [provider]    Physical Exam Vitals:  Vitals:   06/21/21 0818  BP: 132/74    Physical Exam Constitutional:      Appearance: Normal appearance. She is well-developed.  HENT:     Head: Normocephalic and atraumatic.  Eyes:     Extraocular Movements: Extraocular movements intact.     Pupils: Pupils are equal, round, and reactive to light.  Neck:     Thyroid: No thyromegaly.  Cardiovascular:     Rate and Rhythm: Normal rate and regular rhythm.     Heart sounds: Normal heart sounds.  Pulmonary:     Effort: Pulmonary effort is normal.     Breath sounds: Normal breath sounds.  Abdominal:     General: Bowel sounds are normal. There is no distension.     Palpations: Abdomen is soft. There is no mass.     Comments: Incision is clean, dry, intact  Musculoskeletal:     Cervical back: Neck supple.  Neurological:     Mental Status: She is alert and oriented to person, place, and time.  Skin:    General: Skin is warm and dry.  Psychiatric:        Behavior: Behavior normal.        Thought Content: Thought content normal.        Judgment: Judgment normal.  Vitals reviewed.    Assessment: 35 y.o. [id number] presenting for 6 week postpartum visit  Plan: Problem List Items Addressed This Visit   None Visit Diagnoses     Encounter for postoperative care    -  Primary       1) Contraception-  tubal ligation  2)  Pap: 2022 NIL  3) Patient underwent screening  for postpartum depression with no concerns noted.  4) Continue Procardia 60 mg XL daily  5) Incision cleaned and glue partially removed . Patient uncomfortable, so provided her with alcohol swabs to continue removal at home.  - Follow up with Health Department in 4 weeks   Adelene Idler MD, [facility], [facility] 06/21/2021 8:46 AM

## 2021-06-29 ENCOUNTER — Other Ambulatory Visit: Payer: Self-pay | Admitting: Obstetrics and Gynecology

## 2021-06-29 DIAGNOSIS — O99213 Obesity complicating pregnancy, third trimester: Secondary | ICD-10-CM

## 2021-06-29 DIAGNOSIS — Z302 Encounter for sterilization: Secondary | ICD-10-CM

## 2021-06-29 DIAGNOSIS — O0992 Supervision of high risk pregnancy, unspecified, second trimester: Secondary | ICD-10-CM

## 2021-06-29 DIAGNOSIS — Z98891 History of uterine scar from previous surgery: Secondary | ICD-10-CM

## 2021-06-29 DIAGNOSIS — O1493 Unspecified pre-eclampsia, third trimester: Secondary | ICD-10-CM

## 2021-06-29 DIAGNOSIS — O24419 Gestational diabetes mellitus in pregnancy, unspecified control: Secondary | ICD-10-CM

## 2021-06-29 DIAGNOSIS — O1213 Gestational proteinuria, third trimester: Secondary | ICD-10-CM

## 2021-06-29 DIAGNOSIS — Z3A34 34 weeks gestation of pregnancy: Secondary | ICD-10-CM

## 2021-07-17 ENCOUNTER — Encounter: Payer: Self-pay | Admitting: Advanced Practice Midwife

## 2021-07-17 DIAGNOSIS — Z9851 Tubal ligation status: Secondary | ICD-10-CM | POA: Insufficient documentation

## 2021-07-18 ENCOUNTER — Telehealth: Payer: Self-pay

## 2021-07-18 NOTE — Telephone Encounter (Signed)
Contact patient regarding need to fast from 8pm the night before PP appointment to prep for 2 hour gtt.   Patient states she can not make it for appointment on 07/19/21 and she is now rescheduled for 07/26/21 at 0840. Patient aware to not eat, drink, chew gum, or water past 8pm on 07/25/21.   Floy Sabina, RN

## 2021-07-19 ENCOUNTER — Ambulatory Visit: Payer: Self-pay

## 2021-07-26 ENCOUNTER — Ambulatory Visit: Payer: Self-pay | Admitting: Family Medicine

## 2021-07-26 ENCOUNTER — Other Ambulatory Visit: Payer: Self-pay

## 2021-07-26 VITALS — BP 132/88

## 2021-07-26 DIAGNOSIS — O24419 Gestational diabetes mellitus in pregnancy, unspecified control: Secondary | ICD-10-CM

## 2021-07-26 NOTE — Progress Notes (Signed)
Patient here for 2 hour gtt and BP check. Patient had C-section with BTL, 05/29/2021. Had incision check on 06/07/2021 at [facility], and PP visit at [facility] on 06/21/2021 with [place] =0. Patient states she has been fasting since last night at 6:30pm, with nothing to eat or drink since that time.Burt Knack, RN

## 2021-07-26 NOTE — Progress Notes (Signed)
Repeat BP 132/88 manual, reviewed with provider. Patient counseled that she should follow-up with [facility] if she has any bad headaches, blurry vision, starts seeing spots or nausea and vomiting. Patient states understanding.Burt Knack, RN

## 2021-07-27 LAB — GLUCOSE TOLERANCE, 2 HOURS
Glucose, 2 hour: 152 mg/dL — ABNORMAL HIGH (ref 70–139)
Glucose, GTT - Fasting: 91 mg/dL (ref 70–99)

## 2021-07-30 NOTE — Progress Notes (Signed)
Call to patient to discuss results for PP glucose 2 hr gtt.   Pt has Impaired glucose tolerance.   Referral sent to [facility] for pt follow up. Recommended pt to call to schedule appointment with [facility].   Pt verbalizes understanding.    Spanish interpreter used

## 2021-07-31 NOTE — Progress Notes (Signed)
S: Patient in clinic for PP today.  Patient was seen at [facility] on 06/21/2021 for post partum.  Pt cesarean on 05/29/2021. Pt had GDM and preeclampsia.  RX procardia.   O: last pap was 12/11/2020, NIL , Cesarean was 05/29/21, B/P 141/90 automatic, 132/88 and 132/74 manual.    A: Post partum, Gestational diabetes mellitus (GDM) in third trimester.   P: 2 hr gtt today  -Assessed incision from caesarean. Incision is clean, dry,intact, healing well.   -Pt reports taking Procardia as directed.  B/P today was 141/90 with automatic cuff.  Manual B/P 132/88 and 132/74  -recommends ot continue to follow up with PCP about blood pressures and medications.   -Well woman exam due in 1 year.    - Glucose tolerance, 2 hours  V. Olmedo  used for [place] interpretation.     Wendi Snipes, FNP

## 2021-08-01 ENCOUNTER — Telehealth: Payer: Self-pay

## 2021-08-01 NOTE — Telephone Encounter (Signed)
Referral by Hazle Coca, CNM to f/u for impaired glucose tolerance following GDM in pregnancy faxed with okay confirmation to [facility] at fax number [phone].   Floy Sabina, RN

## 2021-10-07 ENCOUNTER — Other Ambulatory Visit: Payer: Self-pay

## 2021-10-07 ENCOUNTER — Emergency Department: Payer: Self-pay

## 2021-10-07 ENCOUNTER — Emergency Department
Admission: EM | Admit: 2021-10-07 | Discharge: 2021-10-07 | Disposition: A | Discharge status: Home / Self Care | Payer: Self-pay | Attending: Emergency Medicine | Admitting: Emergency Medicine

## 2021-10-07 DIAGNOSIS — R1013 Epigastric pain: Secondary | ICD-10-CM

## 2021-10-07 DIAGNOSIS — Z20822 Contact with and (suspected) exposure to covid-19: Secondary | ICD-10-CM | POA: Insufficient documentation

## 2021-10-07 DIAGNOSIS — K810 Acute cholecystitis: Secondary | ICD-10-CM

## 2021-10-07 DIAGNOSIS — K819 Cholecystitis, unspecified: Secondary | ICD-10-CM | POA: Insufficient documentation

## 2021-10-07 DIAGNOSIS — R109 Unspecified abdominal pain: Secondary | ICD-10-CM

## 2021-10-07 LAB — RESP PANEL BY RT-PCR (FLU A&B, COVID) ARPGX2
Influenza A by PCR: NEGATIVE
Influenza B by PCR: NEGATIVE
SARS Coronavirus 2 by RT PCR: NEGATIVE

## 2021-10-07 LAB — COMPREHENSIVE METABOLIC PANEL
ALT: 17 U/L (ref 0–44)
AST: 15 U/L (ref 15–41)
Albumin: 3.4 g/dL — ABNORMAL LOW (ref 3.5–5.0)
Alkaline Phosphatase: 84 U/L (ref 38–126)
Anion gap: 6 (ref 5–15)
BUN: 14 mg/dL (ref 6–20)
CO2: 23 mmol/L (ref 22–32)
Calcium: 8.6 mg/dL — ABNORMAL LOW (ref 8.9–10.3)
Chloride: 106 mmol/L (ref 98–111)
Creatinine, Ser: 0.73 mg/dL (ref 0.44–1.00)
GFR, Estimated: >60 mL/min (ref >60)
Glucose, Bld: 112 mg/dL — ABNORMAL HIGH (ref 70–99)
Potassium: 3.9 mmol/L (ref 3.5–5.1)
Sodium: 135 mmol/L (ref 135–145)
Total Bilirubin: 0.7 mg/dL (ref 0.3–1.2)
Total Protein: 7.5 g/dL (ref 6.5–8.1)

## 2021-10-07 LAB — URINALYSIS, MICROSCOPIC (REFLEX)

## 2021-10-07 LAB — CBC
HCT: 42.1 % (ref 36.0–46.0)
Hemoglobin: 13.6 g/dL (ref 12.0–15.0)
MCH: 26.4 pg (ref 26.0–34.0)
MCHC: 32.3 g/dL (ref 30.0–36.0)
MCV: 81.7 fL (ref 80.0–100.0)
Platelets: 342 10*3/uL (ref 150–400)
RBC: 5.15 MIL/uL — ABNORMAL HIGH (ref 3.87–5.11)
RDW: 13.8 % (ref 11.5–15.5)
WBC: 8.7 10*3/uL (ref 4.0–10.5)
nRBC: 0 % (ref 0.0–0.2)

## 2021-10-07 LAB — URINALYSIS, ROUTINE W REFLEX MICROSCOPIC
Bilirubin Urine: NEGATIVE
Glucose, UA: NEGATIVE mg/dL
Ketones, ur: NEGATIVE mg/dL
Nitrite: NEGATIVE
Protein, ur: >300 mg/dL — AB
Specific Gravity, Urine: >1.03 — ABNORMAL HIGH (ref 1.005–1.030)
pH: 5.5 (ref 5.0–8.0)

## 2021-10-07 LAB — LIPASE, BLOOD: Lipase: 28 U/L (ref 11–51)

## 2021-10-07 LAB — POC URINE PREG, ED: Preg Test, Ur: NEGATIVE

## 2021-10-07 MED ORDER — AMOXICILLIN-POT CLAVULANATE 875-125 MG PO TABS: 1.0000 | ORAL_TABLET | Freq: Two times a day (BID) | ORAL | 0 refills | Status: AC

## 2021-10-07 MED ORDER — ALUM & MAG HYDROXIDE-SIMETH 200-200-20 MG/5ML PO SUSP
15.0000 mL | Freq: Once | ORAL | Status: AC
  Administered 2021-10-07: 12:00:00 15 mL via ORAL
  Filled 2021-10-07: qty 30

## 2021-10-07 MED ORDER — FAMOTIDINE 20 MG PO TABS
20.0000 mg | ORAL_TABLET | Freq: Once | ORAL | Status: AC
  Administered 2021-10-07: 12:00:00 20 mg via ORAL
  Filled 2021-10-07: qty 1

## 2021-10-07 MED ORDER — GADOBUTROL 1 MMOL/ML IV SOLN
7.5000 mL | Freq: Once | INTRAVENOUS | Status: AC | PRN
  Administered 2021-10-07: 16:00:00 7.5 mL via INTRAVENOUS
  Filled 2021-10-07: qty 7.5

## 2021-10-07 MED ORDER — PIPERACILLIN-TAZOBACTAM 3.375 G IVPB 30 MIN
3.3750 g | Freq: Once | INTRAVENOUS | Status: AC
  Administered 2021-10-07: 15:00:00 3.375 g via INTRAVENOUS
  Filled 2021-10-07: qty 50

## 2021-10-07 NOTE — ED Triage Notes (Signed)
Pt c/o burning epigastric pain for the past week, states she has been taking alkilseltzer with no relief. Denies N/V.

## 2021-10-07 NOTE — ED Provider Notes (Addendum)
[facility] Emergency Department Provider Note ____________________________________________   Event Date/Time   First MD Initiated Contact with Patient 10/07/21 1044     (approximate)  I have reviewed the triage vital signs and the nursing notes.   HISTORY  Chief Complaint Abdominal Pain  HPI and ROS obtained via video interpreter  HPI Sandra Maynard is a 35 y.o. female with PMH as noted below who presents with epigastric abdominal pain over the last 6 days, intermittent course, sometimes worse after she eats, but not associated with any nausea, vomiting, or diarrhea.  The patient reports being constipated.  She denies any fever or chills, difficulty breathing, chest pain, or any dysuria, frequency, or hematuria.  She denies any prior history of this pain.  She states that she took Alka-Seltzer for it at home without any relief.  Past Medical History:  Diagnosis Date   Anemia    2012   Depression affecting pregnancy 12/11/2020   Gestational diabetes 04/26/21 04/29/2021   Migraine    Preeclampsia 05/24/2021    Patient Active Problem List   Diagnosis Date Noted   Hx of tubal ligation 05/29/21 07/17/2021   Admission for sterilization    Preeclampsia--severe   Procardia 60 mg XL daily 05/24/2021   Gestational diabetes 04/26/21 04/29/2021   UTI (urinary tract infection) during pregnancy dx'd [facility] ER on 01/29/21 02/08/2021   Poor historian 02/08/2021   Proteinuria affecting pregnancy 12/11/20 spot protein/creat ratio=2,485 12/14/2020   Obesity affecting pregnancy BMI=35.6 12/11/2020   Supervision of high risk pregnancy in second trimester 12/11/2020   Late prenatal care 14 3/7 12/11/2020   History of preterm delivery 12/01/13 34 wks & 02/07/15 at 36 12/11/2020   Low birth weight infant 5 lbs 02/07/15 12/11/2020   Hx macrosomic infant 9 lbs 06/17/11 12/11/2020   Depression affecting pregnancy 12/11/2020   Hemorrhage after delivery of fetus on 06/17/11 500 cc  12/11/2020   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 Amalia Hailey) 08/10/2014    Past Surgical History:  Procedure Laterality Date   CESAREAN SECTION  2012   CESAREAN SECTION WITH BILATERAL TUBAL LIGATION Bilateral 05/29/2021   Procedure: CESAREAN SECTION WITH BILATERAL TUBAL LIGATION;  Surgeon: Gae Dry, MD;  Location: [facility] ORS;  Service: Obstetrics;  Laterality: Bilateral;    Prior to Admission medications   Medication Sig Start Date End Date Taking? Authorizing Provider  amoxicillin-clavulanate (AUGMENTIN) 875-125 MG tablet Take 1 tablet by mouth 2 (two) times daily for 7 days. 10/07/21 10/14/21 Yes Arta Silence, MD  acetaminophen (TYLENOL) 500 MG tablet Take 1 tablet (500 mg total) by mouth every 4 (four) hours as needed for mild pain. Patient not taking: Reported on 07/26/2021 05/31/21   Will Bonnet, MD  ibuprofen (ADVIL) 600 MG tablet Take 1 tablet (600 mg total) by mouth every 6 (six) hours as needed for mild pain or cramping. Patient not taking: Reported on 07/26/2021 05/31/21   Will Bonnet, MD  NIFEdipine (ADALAT CC) 60 MG 24 hr tablet TAKE 1 TABLET BY MOUTH EVERY DAY 07/02/21   Will Bonnet, MD  oxyCODONE-acetaminophen (PERCOCET) 5-325 MG tablet Take 1 tablet by mouth every 4 (four) hours as needed for severe pain. Patient not taking: Reported on 07/26/2021 06/07/21 06/07/22  Gae Dry, MD  Prenatal Vit-Fe Fumarate-FA (MULTIVITAMIN-PRENATAL) 27-0.8 MG TABS tablet Take 1 tablet by mouth daily at 12  noon. Patient not taking: Reported on 07/26/2021    [provider]    Allergies Patient has no known allergies.  Family History  Problem Relation Age of Onset   Hypertension Mother     Social History Social History   Tobacco Use   Smoking status: Never   Smokeless tobacco: Never   Tobacco comments:    Denies secondhand  smoke exposure  Vaping Use   Vaping Use: Never used  Substance Use Topics   Alcohol use: Not Currently   Drug use: Never    Review of Systems  Constitutional: No fever/chills Eyes: No visual changes. ENT: No sore throat. Cardiovascular: Denies chest pain. Respiratory: Denies shortness of breath. Gastrointestinal: No vomiting or diarrhea.  Genitourinary: Negative for dysuria, frequency, hematuria.  Musculoskeletal: Negative for back pain. Skin: Negative for rash. Neurological: Negative for headaches, focal weakness or numbness.   ____________________________________________   PHYSICAL EXAM:  VITAL SIGNS: ED Triage Vitals  Enc Vitals Group     BP 10/07/21 1011 (!) 145/108     Pulse Rate 10/07/21 1011 (!) 113     Resp 10/07/21 1011 17     Temp 10/07/21 1016 98 F (36.7 C)     Temp Source 10/07/21 1011 Oral     SpO2 10/07/21 1011 97 %     Weight 10/07/21 1012 170 lb (77.1 kg)     Height 10/07/21 1012 $RemoveBefor'5\' 4"'[id number]$  (1.626 m)     Head Circumference --      Peak Flow --      Pain Score 10/07/21 1012 8     Pain Loc --      Pain Edu? --      Excl. in [place]? --     Constitutional: Alert and oriented. Well appearing and in no acute distress. Eyes: Conjunctivae are normal.  Head: Atraumatic. Nose: No congestion/rhinnorhea. Mouth/Throat: Mucous membranes are moist.   Neck: Normal range of motion.  Cardiovascular: Normal rate, regular rhythm. Grossly normal heart sounds.  Good peripheral circulation. Respiratory: Normal respiratory effort.  No retractions. Lungs CTAB. Gastrointestinal: Soft with mild tenderness in the right upper quadrant and epigastric area.  No distention.  Genitourinary: No CVA tenderness. Musculoskeletal: No lower extremity edema.  Extremities warm and well perfused.  Neurologic:  Normal speech and language. No gross focal neurologic deficits are appreciated.  Skin:  Skin is warm and dry. No rash noted. Psychiatric: Mood and affect are normal. Speech and  behavior are normal.  ____________________________________________   LABS (all labs ordered are listed, but only abnormal results are displayed)  Labs Reviewed  COMPREHENSIVE METABOLIC PANEL - Abnormal; Notable for the following components:      Result Value   Glucose, Bld 112 (*)    Calcium 8.6 (*)    Albumin 3.4 (*)    All other components within normal limits  CBC - Abnormal; Notable for the following components:   RBC 5.15 (*)    All other components within normal limits  URINALYSIS, ROUTINE W REFLEX MICROSCOPIC - Abnormal; Notable for the following components:   Specific Gravity, Urine >1.030 (*)    Hgb urine dipstick LARGE (*)    Protein, ur >300 (*)    Leukocytes,Ua SMALL (*)    All other components within normal limits  URINALYSIS, MICROSCOPIC (REFLEX) - Abnormal; Notable for the following components:   Bacteria, UA MANY (*)    All other components within normal limits  RESP PANEL BY RT-PCR (FLU A&B, COVID) ARPGX2  LIPASE, BLOOD  POC  URINE PREG, ED   ____________________________________________  EKG   ____________________________________________  RADIOLOGY  US abdomen RUQ:  IMPRESSION:  1. Sonographic appearance suggestive of acute cholecystitis.  2. Dilated common bile duct measuring 11 mm in diameter.   MRCP: IMPRESSION:  1. Wall thickening of a dilated gallbladder which contains  cholelithiasis and biliary sludge, reflecting acute cholecystitis.  2. Mild prominence of the extrahepatic biliary duct with the common  duct measuring 7 mm but with gentle tapering to the level of the  ampulla and no choledocholithiasis identified.    ____________________________________________   PROCEDURES  Procedure(s) performed: No  Procedures  Critical Care performed: No ____________________________________________   INITIAL IMPRESSION / ASSESSMENT AND PLAN / ED COURSE  Pertinent labs & imaging results that were available during my care of the patient were  reviewed by me and considered in my medical decision making (see chart for details).   35 year old female with PMH as noted above presents with epigastric pain over the last 6 days with no significant associated symptoms.  On exam the patient is overall well-appearing.  She was slightly tachycardic in triage with otherwise normal vital signs.  She has some tenderness in the epigastric region and right upper quadrant but no peritoneal signs and the exam is otherwise unremarkable.  Lab work-up obtained from triage is overall reassuring; there is no leukocytosis and LFTs and bilirubin are normal.  The urinalysis shows significant bacteria and WBCs as well as protein although the patient denies any urinary symptoms.  Differential includes gastritis, PUD, GERD, or less likely biliary colic or other hepatobiliary cause.  I do not suspect pyelonephritis or ureteral stone as the etiology of the patient's pain given the location and exam, however the urinalysis does suggest the presence of UTI.  We will obtain a right upper quadrant ultrasound to evaluate gallbladder; if this is negative I anticipate discharge home with antibiotics for UTI and medication for gastritis/GERD.  ----------------------------------------- 6:17 PM on 10/07/2021 -----------------------------------------  Ultrasound revealed findings concerning for acute cholecystitis although with a dilated CBD.  I consulted Dr. Hampton Abbot from general surgery who at that time was about to begin a case in the OR.  Because of the dilated CBD he requested an MRCP to rule out choledocholithiasis.  MRCP is negative for choledocholithiasis although also suggestive of acute cholecystitis.  Dr. Hampton Abbot has evaluated the patient.  At this time, the patient has no pain despite not receiving any pain medication in the ED.  Her lab work-up is very reassuring; she does not have leukocytosis, elevated LFTs or alk phos, bilirubin, lipase.  Her vital signs are  normal and she appears well.  Dr. Hampton Abbot discussed the case extensively with the patient and offered her admission for cholecystectomy although he feels it would be reasonable for her to go home given the resolution of her symptoms, normal labs, and reassuring overall clinical picture; based on shared decision making the patient states that she wants to go home.  She will follow-up with Dr. Hampton Abbot this week; he advises that his office will contact her.  Dr. Hampton Abbot recommended that we start the patient on Augmentin.  Urinalysis shows findings suggestive of possible UTI (although the patient has no urinary symptoms), and the Augmentin will also cover for this.  Dr. Hampton Abbot discussed risks, benefits, follow-up plan, and return precautions.  I went back in with the patient and spoke to her again via the video interpreter and confirmed all of this.  I specifically gave her return precautions and made  sure that she understands the follow-up plan.  All of her questions were answered.  She is stable for discharge at this time.  ____________________________________________   FINAL CLINICAL IMPRESSION(S) / ED DIAGNOSES  Final diagnoses:  Epigastric abdominal pain  Cholecystitis      NEW MEDICATIONS STARTED DURING THIS VISIT:  New Prescriptions   AMOXICILLIN-CLAVULANATE (AUGMENTIN) 875-125 MG TABLET    Take 1 tablet by mouth 2 (two) times daily for 7 days.     Note:  This document was prepared using Dragon voice recognition software and may include unintentional dictation errors.    Arta Silence, MD 10/07/21 1821    Arta Silence, MD 10/07/21 [phone]

## 2021-10-07 NOTE — Discharge Instructions (Addendum)
Take the antibiotic as directed. Follow-up with Dr. Aleen Campi for further evaluation. Return to the Emergency Department for worsening symptoms.   Tome el antibitico segn las indicaciones. Seguimiento con el Dr. Aleen Campi para evaluacin adicional. Regrese al Departamento de Emergencias por empeoramiento de los sntomas.

## 2021-10-07 NOTE — ED Notes (Signed)
Called in the waiting are with no answer.

## 2021-10-07 NOTE — Consult Note (Signed)
Date of Consultation:  10/07/2021  Requesting Physician:  Dionne Bucy, MD  Reason for Consultation:  Acute cholecystitis  History of Present Illness: Sandra Maynard is a 35 y.o. female presenting with a 1-week history of epigastric and RUQ abdominal pain.  The patient reports that a week ago, she started having pain in the epigastric area radiating towards her right upper quadrant.  This pain has remained constant over the week.  No other associated symptoms and denies any nausea or vomiting.  She reports that the pain may get somewhat worse after eating but has not noticed any food in particular that does not cause any worsening of the pain.  Denies any fevers or chills, chest pain, shortness of breath.  She did try Alka-Seltzer at home for this pain but there is no relief.  In the emergency room, the patient's laboratory work-up was unremarkable with a white blood cell count of 8.7, total bilirubin 0.7, AST 15, ALT 17, alkaline phosphatase 84, and lipase 28.  The patient reports that in the emergency room, her pain fully resolved and currently she is symptom-free.  She did have an ultrasound once her pain was improved which showed mild diffuse gallbladder wall thickening of up to less than 4 mm with cholelithiasis but no pericholecystic fluid.  There is a negative Murphy sign while doing the ultrasound.  However the ultrasound did show a common bile duct of 11 mm without any choledocholithiasis.  As a precaution, the patient had an MRCP which was negative for choledocholithiasis and did show gallbladder wall thickening as well.  During my evaluation, the patient again was symptom-free and denied any further pain.  She never received any narcotic pain medication while in the emergency room and only received a dose of Maalox and Pepcid.  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 Authorizing Provider  amoxicillin-clavulanate (AUGMENTIN) 875-125 MG tablet Take 1 tablet by mouth 2 (two) times daily for 7 days. 10/07/21 10/14/21 Yes Dionne Bucy, MD  acetaminophen (TYLENOL) 500 MG tablet Take 1 tablet (500 mg total) by mouth every 4 (four) hours as needed for mild pain. Patient not taking: Reported on 07/26/2021 05/31/21   Conard Novak, MD  ibuprofen (ADVIL) 600 MG tablet Take 1 tablet (600 mg total) by mouth every 6 (six) hours as needed for mild pain or cramping. Patient not taking: Reported on 07/26/2021 05/31/21   Conard Novak, MD  NIFEdipine (ADALAT CC) 60 MG 24 hr tablet TAKE 1 TABLET BY MOUTH EVERY DAY 07/02/21   Conard Novak, MD  oxyCODONE-acetaminophen (PERCOCET) 5-325 MG tablet Take 1 tablet by mouth every 4 (four) hours as needed for severe pain. Patient not taking: Reported on 07/26/2021 06/07/21 06/07/22  Nadara Mustard, MD  Prenatal Vit-Fe Fumarate-FA (MULTIVITAMIN-PRENATAL) 27-0.8 MG TABS tablet Take 1 tablet by mouth daily at 12 noon. Patient not taking: Reported on 07/26/2021    [provider]    Allergies: No Known Allergies  Social History:  reports that she has never smoked. She has never used smokeless tobacco. She reports that she does not currently use alcohol. She reports that she does not use drugs.   Family History: Family History  Problem Relation Age of Onset   Hypertension Mother     Review of Systems: Review of Systems   Constitutional:  Negative for chills and fever.  HENT:  Negative for hearing loss.   Respiratory:  Negative for shortness of breath.   Cardiovascular:  Negative for chest pain.  Gastrointestinal:  Positive for abdominal pain. Negative for constipation, diarrhea, nausea and vomiting.  Genitourinary:  Negative for dysuria.  Musculoskeletal:  Negative for myalgias.  Skin:  Negative for rash.  Neurological:  Negative for dizziness.  Psychiatric/Behavioral:  Negative for depression.    Physical Exam BP (!) 145/95 (BP Location: Right Arm)    Pulse 90    Temp 98.6 F (37 C) (Oral)    Resp 20    Ht  (1.626 m)    Wt 77.1 kg    LMP 09/08/2021 (Exact Date)    SpO2 100%    BMI 29.18 kg/m  CONSTITUTIONAL: No acute distress, well-nourished HEENT:  Normocephalic, atraumatic, extraocular motion intact. NECK: Trachea is midline, and there is no jugular venous distension. RESPIRATORY:  Normal respiratory effort without pathologic use of accessory muscles. CARDIOVASCULAR: Regular rhythm and rate. GI: The abdomen is soft, nondistended, with only some soreness to palpation in the right upper quadrant.  Negative Murphy's sign and no particular pain in the epigastric area.  Overall benign abdomen.  MUSCULOSKELETAL:  Normal muscle strength and tone in all four extremities.  No peripheral edema or cyanosis. SKIN: Skin turgor is normal. There are no pathologic skin lesions.  NEUROLOGIC:  Motor and sensation is grossly normal.  Cranial nerves are grossly intact. PSYCH:  Alert and oriented to person, place and time. Affect is normal.  Laboratory Analysis: Results for orders placed or performed during the hospital encounter of 10/07/21 (from the past 24 hour(s))  Lipase, blood     Status: None   Collection Time: 10/07/21 10:15 AM  Result Value Ref Range   Lipase 28 11 - 51 U/L  Comprehensive metabolic panel     Status: Abnormal   Collection Time: 10/07/21 10:15 AM  Result Value Ref Range   Sodium 135  135 - 145 mmol/L   Potassium 3.9 3.5 - 5.1 mmol/L   Chloride 106 98 - 111 mmol/L   CO2 23 22 - 32 mmol/L   Glucose, Bld 112 (H) 70 - 99 mg/dL   BUN 14 6 - 20 mg/dL   Creatinine, Ser 4.09 0.44 - 1.00 mg/dL   Calcium 8.6 (L) 8.9 - 10.3 mg/dL   Total Protein 7.5 6.5 - 8.1 g/dL   Albumin 3.4 (L) 3.5 - 5.0 g/dL   AST 15 15 - 41 U/L   ALT 17 0 - 44 U/L   Alkaline Phosphatase 84 38 - 126 U/L   Total Bilirubin 0.7 0.3 - 1.2 mg/dL   GFR, Estimated >81 >19 mL/min   Anion gap 6 5 - 15  CBC     Status: Abnormal   Collection Time: 10/07/21 10:15 AM  Result Value Ref Range   WBC 8.7 4.0 - 10.5 K/uL   RBC 5.15 (H) 3.87 - 5.11 MIL/uL   Hemoglobin 13.6 12.0 - 15.0 g/dL   HCT 14.7 82.9 - 56.2 %  MCV 81.7 80.0 - 100.0 fL   MCH 26.4 26.0 - 34.0 pg   MCHC 32.3 30.0 - 36.0 g/dL   RDW 38.1 82.9 - 93.7 %   Platelets 342 150 - 400 K/uL   nRBC 0.0 0.0 - 0.2 %  Urinalysis, Routine w reflex microscopic     Status: Abnormal   Collection Time: 10/07/21 10:15 AM  Result Value Ref Range   Color, Urine YELLOW YELLOW   APPearance CLEAR CLEAR   Specific Gravity, Urine >1.030 (H) 1.005 - 1.030   pH 5.5 5.0 - 8.0   Glucose, UA NEGATIVE NEGATIVE mg/dL   Hgb urine dipstick LARGE (A) NEGATIVE   Bilirubin Urine NEGATIVE NEGATIVE   Ketones, ur NEGATIVE NEGATIVE mg/dL   Protein, ur >169 (A) NEGATIVE mg/dL   Nitrite NEGATIVE NEGATIVE   Leukocytes,Ua SMALL (A) NEGATIVE  Urinalysis, Microscopic (reflex)     Status: Abnormal   Collection Time: 10/07/21 10:15 AM  Result Value Ref Range   RBC / HPF 6-10 0 - 5 RBC/hpf   WBC, UA 11-20 0 - 5 WBC/hpf   Bacteria, UA MANY (A) NONE SEEN   Squamous Epithelial / LPF 11-20 0 - 5  POC urine preg, ED     Status: None   Collection Time: 10/07/21 10:22 AM  Result Value Ref Range   Preg Test, Ur NEGATIVE NEGATIVE  Resp Panel by RT-PCR (Flu A&B, Covid) Nasopharyngeal Swab     Status: None   Collection Time: 10/07/21  2:49 PM   Specimen: Nasopharyngeal Swab;  Nasopharyngeal(NP) swabs in vial transport medium  Result Value Ref Range   SARS Coronavirus 2 by RT PCR NEGATIVE NEGATIVE   Influenza A by PCR NEGATIVE NEGATIVE   Influenza B by PCR NEGATIVE NEGATIVE    Imaging: MR 3D Recon At Scanner  Result Date: 10/07/2021 CLINICAL DATA:  Burning epigastric pain x1 week. EXAM: MRI ABDOMEN WITHOUT AND WITH CONTRAST (INCLUDING MRCP) TECHNIQUE: Multiplanar multisequence MR imaging of the abdomen was performed both before and after the administration of intravenous contrast. Heavily T2-weighted images of the biliary and pancreatic ducts were obtained, and three-dimensional MRCP images were rendered by post processing. CONTRAST:  7.85mL GADAVIST GADOBUTROL 1 MMOL/ML IV SOLN COMPARISON:  Same-day right upper quadrant ultrasound. FINDINGS: Lower chest: No acute abnormality. Hepatobiliary: No suspicious hepatic lesion. Wall thickening of a dilated gallbladder which contains cholelithiasis and biliary sludge. The cystic duct is dilated with without intraluminal filling defect identified possibly reflecting no intrahepatic biliary ductal dilation. Prominence of the extrahepatic biliary duct with the common duct measuring 7 mm but with gentle tapering to the level of the ampulla and no intraluminal filling defects visualized. Pancreas: Intrinsic T1 signal a pancreatic parenchyma is within normal limits. No pancreatic ductal dilation. No cystic or solid hyperenhancing pancreatic lesion identified. Spleen:  Within normal limits. Adrenals/Urinary Tract: The bilateral adrenal glands are unremarkable. No hydronephrosis. Stomach/Bowel: Visualized portions within the abdomen are unremarkable. Vascular/Lymphatic: No pathologically enlarged lymph nodes identified. No abdominal aortic aneurysm demonstrated. Other:  No abdominal ascites. Musculoskeletal: No suspicious bone lesions identified. IMPRESSION: 1. Wall thickening of a dilated gallbladder which contains cholelithiasis and biliary  sludge, reflecting acute cholecystitis. 2. Mild prominence of the extrahepatic biliary duct with the common duct measuring 7 mm but with gentle tapering to the level of the ampulla and no choledocholithiasis identified. Electronically Signed   By: Maudry Mayhew M.D.   On: 10/07/2021 16:39   MR ABDOMEN MRCP W WO CONTAST  Result Date: 10/07/2021 CLINICAL DATA:  Burning epigastric pain x1 week. EXAM: MRI ABDOMEN WITHOUT AND WITH CONTRAST (INCLUDING MRCP) TECHNIQUE: Multiplanar multisequence MR imaging of the abdomen was performed both before and after the administration of intravenous contrast. Heavily T2-weighted images of the biliary and pancreatic ducts were obtained, and three-dimensional MRCP images were rendered by post processing. CONTRAST:  7.77mL GADAVIST GADOBUTROL 1 MMOL/ML IV SOLN COMPARISON:  Same-day right upper quadrant ultrasound. FINDINGS: Lower chest: No acute abnormality. Hepatobiliary: No suspicious hepatic lesion. Wall thickening of a dilated gallbladder which contains cholelithiasis and biliary sludge. The cystic duct is dilated with without intraluminal filling defect identified possibly reflecting no intrahepatic biliary ductal dilation. Prominence of the extrahepatic biliary duct with the common duct measuring 7 mm but with gentle tapering to the level of the ampulla and no intraluminal filling defects visualized. Pancreas: Intrinsic T1 signal a pancreatic parenchyma is within normal limits. No pancreatic ductal dilation. No cystic or solid hyperenhancing pancreatic lesion identified. Spleen:  Within normal limits. Adrenals/Urinary Tract: The bilateral adrenal glands are unremarkable. No hydronephrosis. Stomach/Bowel: Visualized portions within the abdomen are unremarkable. Vascular/Lymphatic: No pathologically enlarged lymph nodes identified. No abdominal aortic aneurysm demonstrated. Other:  No abdominal ascites. Musculoskeletal: No suspicious bone lesions identified. IMPRESSION: 1. Wall  thickening of a dilated gallbladder which contains cholelithiasis and biliary sludge, reflecting acute cholecystitis. 2. Mild prominence of the extrahepatic biliary duct with the common duct measuring 7 mm but with gentle tapering to the level of the ampulla and no choledocholithiasis identified. Electronically Signed   By: Maudry Mayhew M.D.   On: 10/07/2021 16:39   US Abdomen Limited RUQ (LIVER/GB)  Result Date: 10/07/2021 CLINICAL DATA:  Epigastric pain EXAM: ULTRASOUND ABDOMEN LIMITED RIGHT UPPER QUADRANT COMPARISON:  None. FINDINGS: Gallbladder: Sludge and shadowing stones are present within the gallbladder lumen. Largest stone measures up to 8 mm in diameter. Mild diffuse gallbladder wall thickening. No A positive sonographic Murphy sign noted by sonographer. Common bile duct: Diameter: 11 mm. No stones are seen within the visualized portion of the common bile duct. Liver: No focal lesion identified. Within normal limits in parenchymal echogenicity. Portal vein is patent on color Doppler imaging with normal direction of blood flow towards the liver. Other: None. IMPRESSION: 1. Sonographic appearance suggestive of acute cholecystitis. 2. Dilated common bile duct measuring 11 mm in diameter. Electronically Signed   By: Duanne Guess D.O.   On: 10/07/2021 13:31    Assessment and Plan: This is a 35 y.o. female with a 1 week history of right upper quadrant pain with findings concerning for possible acute cholecystitis.  - I personally viewed the images including ultrasound and MRCP and agree that there are some mild wall thickening of the gallbladder but otherwise no pericholecystic fluid.  The patient's laboratory work-up has been normal with a normal WBC and normal LFTs.  On my evaluation, the patient was symptom-free without any further pain, and only some mild soreness in the right upper quadrant to palpation.  She never received any narcotics. - Discussed with the patient that clinically, I  would consider that she is having more of a biliary colic type of episode even though this lasted for a week while her ultrasound there are some mild gallbladder wall thickening.  There may be a component of chronic cholecystitis at this point but currently the patient is symptom-free.  Discussed with her the potential options for admission to the hospital for cholecystectomy whether it is tonight or tomorrow which would involve a hospital stay, versus potential close  outpatient follow-up to schedule her for outpatient elective surgery.  Discussed with her the potential benefits or disadvantage of of both options and she has opted for discharge to home. - As a precaution, given that there is concern for cholecystitis, I have asked Dr. Marisa Severin to give the patient a course of Augmentin.  Our office will contact her this week for follow-up appointment.  Given the patient's strict instructions on avoiding greasy or fatty foods and maintaining a low-fat diet as well as return precautions.  Patient understands this plan, and all of her questions have been answered. -Patient is cleared from the surgical standpoint for discharge to home.   Howie Ill, MD [facility] Pg:  [phone]

## 2021-10-25 ENCOUNTER — Other Ambulatory Visit: Payer: Self-pay

## 2021-10-25 ENCOUNTER — Encounter: Payer: Self-pay | Admitting: Surgery

## 2021-10-25 ENCOUNTER — Ambulatory Visit (INDEPENDENT_AMBULATORY_CARE_PROVIDER_SITE_OTHER): Payer: Self-pay | Admitting: Surgery

## 2021-10-25 VITALS — BP 132/82 | HR 78 | Temp 98.3°F | Ht 60.0 in | Wt 179.0 lb

## 2021-10-25 DIAGNOSIS — K819 Cholecystitis, unspecified: Secondary | ICD-10-CM

## 2021-10-25 DIAGNOSIS — K8000 Calculus of gallbladder with acute cholecystitis without obstruction: Secondary | ICD-10-CM

## 2021-10-25 NOTE — Progress Notes (Signed)
10/25/2021  History of Present Illness: Sandra Maynard is a 36 y.o. female presenting for follow up of possible acute cholecystitis.  She presented to the ED on 12/26 with RUQ pain and on U/S there was concern for possible cholecystitis with mild diffuse wall thickening.  However, her pain resolved by the time I was able to see her in the ER and decision was made to discharge her with close follow up.  The patient reports that since her ED visit, she has been doing well, without significant issues except for another milder episode of pain 6 days ago.  Denies any other issues since.  She has been trying to adhere to a low fat diet.  Denies any fevers, chills, chest pain, shortness of breath.  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 Authorizing Provider  ibuprofen (ADVIL) 600 MG tablet Take 1 tablet (600 mg total) by mouth every 6 (six) hours as needed for mild pain or cramping. 05/31/21  Yes Will Bonnet, MD  NIFEdipine (ADALAT CC) 60 MG 24 hr tablet TAKE 1 TABLET BY MOUTH EVERY DAY Patient not taking: Reported on 10/25/2021 07/02/21   Will Bonnet, MD    Allergies: No Known Allergies  Review of Systems: Review of Systems  Constitutional:  Negative for chills and fever.  HENT:  Negative for hearing loss.   Respiratory:  Negative for shortness of breath.   Cardiovascular:  Negative for  chest pain.  Gastrointestinal:  Positive for abdominal pain. Negative for constipation, diarrhea, nausea and vomiting.  Genitourinary:  Negative for dysuria.  Musculoskeletal:  Negative for myalgias.  Skin:  Negative for rash.  Neurological:  Negative for dizziness.  Psychiatric/Behavioral:  Negative for depression.    Physical Exam BP 132/82    Pulse 78    Temp 98.3 F (36.8 C)    Ht 5' (1.524 m)    Wt 179 lb (81.2 kg)    LMP 10/08/2021 (Exact Date)    SpO2 98%    BMI 34.96 kg/m  CONSTITUTIONAL: No acute distress HEENT:  Normocephalic, atraumatic, extraocular motion intact. NECK:  Trachea is midline, no jugular venous distention. RESPIRATORY:  Lungs are clear, and breath sounds are equal bilaterally. Normal respiratory effort without pathologic use of accessory muscles. CARDIOVASCULAR: Heart is regular without murmurs, gallops, or rubs. GI: The abdomen is soft, non-distended, with some mild soreness in the RUQ.  Negative Murphy's sign. There were no palpable masses.  MUSCULOSKELETAL:  Normal gait, no peripheral edema. NEUROLOGIC:  Motor and sensation is grossly normal.  Cranial nerves are grossly intact. PSYCH:  Alert and oriented to person, place and time. Affect is normal.  Labs/Imaging: Labs from 10/07/21: Na 135, K 3.9, Cl 106, CO2 23, BUN 14, Cr 0.73.  Total bili 0.7, AST 15, ALT 17, Alk Phos 84, lipase 28.  WBC 8.7, Hgb 13.6, Hct 42.1, Plt 342.  U/S RUQ 10/07/21: IMPRESSION: 1. Sonographic  suggestive of acute cholecystitis. °2. Dilated common bile duct measuring 11 mm in diameter. ° °MRCP 10/07/21: °IMPRESSION: °1. Wall thickening of a dilated gallbladder which contains °cholelithiasis and biliary sludge, reflecting acute cholecystitis. °2. Mild prominence of the extrahepatic biliary duct with the common duct measuring 7 mm but with gentle tapering to the level of the ampulla and no choledocholithiasis identified. ° °Assessment and Plan: °This is a 35 y.o. female with  recent episode of possible cholecystitis. ° °--Discussed with the patient again the findings on her imaging and lab studies on 12/26.  The patient is currently doing well, though she did have a milder episode of pain 6 days ago.  She's doing well now without any issues. °--Discussed with her again the plan for interval cholecystectomy in the form of robotic assisted cholecystectomy with ICG cholangiogram.  Reviewed the surgery at length with her, including the risks of bleeding, infection, injury to surrounding structures, post-op activity restriction, that this is an outpatient surgery, and she's willing to proceed. °--Based on her date/time preference, will schedule her on 11/19/21. ° °I spent 40 minutes dedicated to the care of this patient on the date of this encounter to include pre-visit review of records, face-to-face time with the patient discussing diagnosis and management, and any post-visit coordination of care. ° ° °Abshir Paolini Luis Caoilainn Sacks, MD °[facility] ° ° °  °

## 2021-10-25 NOTE — Patient Instructions (Addendum)
nuestro programador de [place] lo llamar para repasar la informacin de la ciruga su ciruga se [facility] por el Dr. Aleen Campi.   Cholcystectomie mini-invasive Minimally Invasive Cholecystectomy Une cholcystectomie mini-invasive est une intervention chirurgicale destine  retirer la vsicule biliaire. La vsicule biliaire est un organe en forme de poire situ sous le foie, du ct Sears Holdings Corporation. La vsicule biliaire stocke la bile, c'est--dire le liquide Corporate investment banker graisses. Une cholcystectomie est souvent pratique pour traiter une inflammation (irritation accompagne d'un gonflement) de la vsicule biliaire (cholcystite). Cette affection est gnralement due  une accumulation de calculs biliaires (chollithiase) dans la vsicule biliaire. Elle survient galement lorsque le liquide prsent dans la vsicule biliaire cesse de Special educational needs teacher en raison de calculs biliaires coincs dans les voies biliaires (tubes) et empchant l'coulement de la bile. Cela peut entraner une inflammation et [employer]ell [employer]. Dans les cas graves, une chirurgie d'urgence peut tre [place]. Cette intervention est ralise en pratiquant de petites incisions dans l'abdomen au lieu Bear Ste[employer]s. Ce type de chirurgie est aussi appel  chirurgie laparoscopique . Un tube mince quip d'une camra (laparoscope) est introduit  travers une incision. Des instruments chirurgicaux sont ensuite introduits dans les autres incisions. Dans certains cas, l'intervention chirurgicale mini-invasive pourra devoir tre remplace par une intervention chirurgicale dans laquelle une incision plus importante devra tre ralise. C'est ce qu'on appelle une chirurgie ouverte. Informez un prestataire de soins de sant : De toutes les allergies que vous prsentez. De tous les mdicaments que vous prenez, y compris les vitamines, les plantes mdicinales, les gouttes ophtalmiques, les crmes et les mdicaments en  Goodrich Corporation. De tout problme que vous ou les membres de votre famille avez eu avec des mdicaments anesthsiques. Si vous saignez de [employer] First American. Des interventions chirurgicales que vous avez subies antrieurement. Des maladies dont vous tes atteint(e). Si vous tes enceinte ou pensez que vous pourriez l'tre. Quels sont les risques ? Cette procdure est gnralement sans danger. Cependant, des Marriott, notamment : Une infection. Des saignements. Des ractions allergiques aux mdicaments. Des lsions  des structures ou organes proches du site de l'intervention. Un calcul biliaire restant dans le conduit choldoque. Le conduit choldoque conduit la bile de la vsicule biliaire  l'intestin grle. Une fuite de la bile provenant du foie ou du canal cystique aprs le retrait de la vsicule biliaire. Mdicaments Contactez votre prestataire de soins de sant si vous dsirez : Database administrator ou cesser la prise de vos mdicaments habituels. Cela est primordial, notamment si vous suivez un traitement contre le diabte ou si vous prenez des anticoagulants. Prendre des Textron Inc l'aspirine et l'ibuprofne. Ces mdicaments peuvent fluidifier votre sang. Ne prenez pas ces mdicaments  moins que votre prestataire de soins de sant ne vous dise de Therapist, music. Prendre des Country[employer] Financial, des vitamines, des remdes  base de plantes et des supplments. Instructions gnrales Si vous allez rentrer chez vous juste aprs l'intervention, demandez  un adulte responsable : De vous reconduire chez vous lorsque vous quitterez l'hpital ou la [place]. Vous ne serez pas autoris(e)  conduire. De s'occuper de vous pendant la dure qui vous aura t indique. N'utilisez pas de Systems developer de la nicotine ou du tabac, pendant au moins les 4 semaines prcdant l'intervention. Il s'agit notamment des cigarettes, du tabac  mcher et des vapoteuses, comme les cigarettes  lectroniques. Si vous avez besoin d'aide pour arrter de fumer, Editor, commissioning de [place] de  sant. Demandez  votre prestataire de soins de sant : Greggory Stallione quelle faon sera marqu le site de Fish farm managervotre intervention chirurgicale. Quelles mesures seront prises pour prvenir les infections. Ces mesures pourraient comprendre : Le rasage des poils  l'endroit de Dance movement psychotherapistl'intervention chirurgicale. Le nettoyage de la peau  l'aide d'un savon antiseptique. La prise d'un antibiotique. Que se passe-t-il pendant l'intervention ?  Un tube intraveineux sera introduit dans l'une de vos veines. Vous recevrez un ou plusieurs des traitements suivants : Un mdicament pour vous aider  vous dtendre (tranquillisant). Un mdicament pour vous endormir (anesthsie gnrale). Le chirurgien pratiquera plusieurs petites incisions dans votre abdomen. Le laparoscope sera insr  travers l'une des petites incisions. La camra du laparoscope Autolivenverra des images  un cran se trouvant dans la salle d'opration. Cela permettra au chirurgien de visualiser l'intrieur de votre abdomen. Un gaz sera pomp dans votre abdomen. Il largira votre abdomen pour Chubb Corporationdonner au chirurgien plus de place pour L-3 Communicationseffectuer l'intervention. D'autres outils ncessaires  l'intervention seront insrs  travers les autres incisions. La vsicule biliaire sera enleve  travers l'une des incisions. Votre conduit choldoque pourra galement tre examin. Si des calculs sont trouvs Smith Internationaldans le conduit choldoque, ils pourront tre NCR Corporationenlevs. Aprs que votre vsicule biliaire a t enleve, les incisions seront refermes  l'aide de points (sutures), d'agrafes ou de colle cutane. Vos incisions seront recouvertes d'un bandage (pansement). La procdure peut varier en fonction des prestataires de soins de sant et des hpitaux. Que se passe-t-il aprs l'intervention ? Votre tension artrielle, votre frquence cardiaque, votre frquence respiratoire et  votre taux Tenet Healthcared'oxygne dans le sang seront surveills jusqu' ce que vous sortiez de l'hpital ou de Transport plannerla clinique. Vous recevrez des Midwifemdicaments pour soulager la douleur si Readerncessaire. Un drain pourrait tre plac dans votre incision. Le drain sera retir Rohm and Haasle lendemain ou le surlendemain de l'intervention. Rsum Une cholcystectomie mini-invasive, galement appele cholcystectomie laparoscopique, est une intervention chirurgicale destine  retirer la vsicule biliaire par le biais de petites incisions. Informez votre prestataire de soins de sant des problmes mdicaux dont vous souffrez et de tous les mdicaments que vous prenez pour les traiter. Avant l'intervention, suivez les instructions qui vous auront t donnes concernant les moments auxquels vous devrez cesser de manger, de boire et de prendre vos mdicaments. Demander  un adulte responsable de s'occuper de vous une fois que vous aurez quitt l'hpital ou la clinique pendant la dure qui vous aura t indique.   Plan de alimentacin para problemas de vescula biliar Gallbladder Eating Plan Si tiene una afeccin de la vescula biliar, puede tener problemas para digerir las grasas. Consumir una dieta con bajo contenido de grasas puede Honeywelldisminuir los sntomas, y puede ser beneficiosa antes y despus de [place]una ciruga de extraccin de vescula biliar (colecistectoma). El mdico puede recomendarle que trabaje con un especialista en dietas y alimentacin (nutricionista) para que lo ayude a reducir la cantidad de grasas en su dieta. Consejos para seguir este plan [place] generales Limite el consumo de grasas a menos del 30 % del total de caloras diarias. Si usted ingiere alrededor de 1800 caloras diarias, esto es menos de 60 gramos (g) de Automotive engineergrasas por da. La grasa es una parte importante de una dieta saludable. Consumir una dieta con bajo contenido de grasas puede dificultar mantener un peso corporal saludable. Pregunte a su nutricionista qu  cantidad de grasas, caloras y otros nutrientes necesita diariamente. Haga comidas pequeas y frecuentes Freight forwarderdurante el da en lugar de tres comidas abundantes. Beba de 8 a 10  vasos de lquido por [employer] Life Insurance. Beba suficiente lquido como para mantener la orina clara o de color amarillo plido. Limite el consumo de alcohol a no ms de 1 medida por da si es mujer y no est Orthoptist, y a 2 medidas por da si es hombre. Una medida equivale a 12 oz de cerveza, 5 oz de vino o 1 oz de bebidas alcohlicas de alta graduacin. Al leer las etiquetas de los alimentos  Consulte la informacin nutricional en las etiquetas de los alimentos para conocer la cantidad de grasas por porcin. Elija alimentos con menos de 3 gramos de grasas por porcin. Al ir de compras Elija alimentos saludables sin grasas o con bajo contenido de grasas. Busque las palabras sin grasa, bajo en grasas o con bajo contenido de [place]. Evite comprar alimentos procesados o preenvasados. Al cocinar Para cocinar opte por mtodos con bajo contenido de grasa, como hornear, hervir, Software engineer y Transport planner. Cocine con pequeas cantidades de grasas saludables, como aceite de [place], aceite de semilla de [place], aceite de canola o [place]. Qu alimentos se recomiendan? Todas las frutas y verduras frescas, congeladas o enlatadas. Cereales integrales. Leche y yogur semidescremados y descremados. Vita [place], aves sin piel, pescado, huevos y legumbres. Suplementos proteicos con bajo contenido de grasas, en polvo o lquidos. Hierbas y especias. Qu alimentos no se recomiendan? Alimentos muy grasos. Entre estos se incluyen productos panificados, comida rpida, cortes de carne con grasa, helados, pan francs, rosquillas dulces, pizza, pan de queso, alimentos cubiertos con [place], salsas con crema o queso. Comidas fritas. Se incluyen papas fritas, tempura, pescado rebozado, milanesas de pollo, panes fritos y dulces. Alimentos con Engel[employer] Corporation. Alimentos que causan gases o meteorismo. Resumen Una dieta de bajo contenido graso puede ser beneficiosa si tiene una afeccin de la vescula biliar o puede hacerla antes y despus de someterse a una ciruga de vescula. Limite el consumo de grasas a menos del 30 % del total de caloras diarias. Esto es casi 60 gramos de grasa si usted ingiere 1800 caloras diarias. Haga comidas pequeas y frecuentes Freight forwarder de tres comidas abundantes. Esta informacin no tiene Theme park manager el consejo del mdico. Asegrese de hacerle al mdico cualquier pregunta que tenga. Document Revised: 07/25/2020 Document Reviewed: 07/25/2020 Elsevier Patient Education  2022 [employer].

## 2021-10-25 NOTE — H&P (View-Only) (Signed)
10/25/2021  History of Present Illness: Dalaysia Harms is a 36 y.o. female presenting for follow up of possible acute cholecystitis.  She presented to the ED on 12/26 with RUQ pain and on U/S there was concern for possible cholecystitis with mild diffuse wall thickening.  However, her pain resolved by the time I was able to see her in the ER and decision was made to discharge her with close follow up.  The patient reports that since her ED visit, she has been doing well, without significant issues except for another milder episode of pain 6 days ago.  Denies any other issues since.  She has been trying to adhere to a low fat diet.  Denies any fevers, chills, chest pain, shortness of breath.  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 Authorizing Provider  ibuprofen (ADVIL) 600 MG tablet Take 1 tablet (600 mg total) by mouth every 6 (six) hours as needed for mild pain or cramping. 05/31/21  Yes Will Bonnet, MD  NIFEdipine (ADALAT CC) 60 MG 24 hr tablet TAKE 1 TABLET BY MOUTH EVERY DAY Patient not taking: Reported on 10/25/2021 07/02/21   Will Bonnet, MD    Allergies: No Known Allergies  Review of Systems: Review of Systems  Constitutional:  Negative for chills and fever.  HENT:  Negative for hearing loss.   Respiratory:  Negative for shortness of breath.   Cardiovascular:  Negative for  chest pain.  Gastrointestinal:  Positive for abdominal pain. Negative for constipation, diarrhea, nausea and vomiting.  Genitourinary:  Negative for dysuria.  Musculoskeletal:  Negative for myalgias.  Skin:  Negative for rash.  Neurological:  Negative for dizziness.  Psychiatric/Behavioral:  Negative for depression.    Physical Exam BP 132/82    Pulse 78    Temp 98.3 F (36.8 C)    Ht 5' (1.524 m)    Wt 179 lb (81.2 kg)    LMP 10/08/2021 (Exact Date)    SpO2 98%    BMI 34.96 kg/m  CONSTITUTIONAL: No acute distress HEENT:  Normocephalic, atraumatic, extraocular motion intact. NECK:  Trachea is midline, no jugular venous distention. RESPIRATORY:  Lungs are clear, and breath sounds are equal bilaterally. Normal respiratory effort without pathologic use of accessory muscles. CARDIOVASCULAR: Heart is regular without murmurs, gallops, or rubs. GI: The abdomen is soft, non-distended, with some mild soreness in the RUQ.  Negative Murphy's sign. There were no palpable masses.  MUSCULOSKELETAL:  Normal gait, no peripheral edema. NEUROLOGIC:  Motor and sensation is grossly normal.  Cranial nerves are grossly intact. PSYCH:  Alert and oriented to person, place and time. Affect is normal.  Labs/Imaging: Labs from 10/07/21: Na 135, K 3.9, Cl 106, CO2 23, BUN 14, Cr 0.73.  Total bili 0.7, AST 15, ALT 17, Alk Phos 84, lipase 28.  WBC 8.7, Hgb 13.6, Hct 42.1, Plt 342.  U/S RUQ 10/07/21: IMPRESSION: 1. Sonographic  appearance suggestive of acute cholecystitis. 2. Dilated common bile duct measuring 11 mm in diameter.  MRCP 10/07/21: IMPRESSION: 1. Wall thickening of a dilated gallbladder which contains cholelithiasis and biliary sludge, reflecting acute cholecystitis. 2. Mild prominence of the extrahepatic biliary duct with the common duct measuring 7 mm but with gentle tapering to the level of the ampulla and no choledocholithiasis identified.  Assessment and Plan: This is a 36 y.o. female with  recent episode of possible cholecystitis.  --Discussed with the patient again the findings on her imaging and lab studies on 12/26.  The patient is currently doing well, though she did have a milder episode of pain 6 days ago.  She's doing well now without any issues. --Discussed with her again the plan for interval cholecystectomy in the form of robotic assisted cholecystectomy with ICG cholangiogram.  Reviewed the surgery at length with her, including the risks of bleeding, infection, injury to surrounding structures, post-op activity restriction, that this is an outpatient surgery, and she's willing to proceed. --Based on her date/time preference, will schedule her on 11/19/21.  I spent 40 minutes dedicated to the care of this patient on the date of this encounter to include pre-visit review of records, face-to-face time with the patient discussing diagnosis and management, and any post-visit coordination of care.   Melvyn Neth, [facility]

## 2021-10-28 ENCOUNTER — Telehealth: Payer: Self-pay | Admitting: Surgery

## 2021-10-28 NOTE — Telephone Encounter (Signed)
[employer], spoke with interpreter [place], ID# [id number] who interpreted.    Patient has been advised of Pre-Admission date/time, COVID Testing date and Surgery date.  Surgery Date: 11/19/21 Preadmission Testing Date: 11/11/21 (phone 8a-1p) Covid Testing Date: Not needed.     Patient has been made aware to call [phone], between 1-3:00pm the day before surgery, to find out what time to arrive for surgery.

## 2021-11-11 ENCOUNTER — Other Ambulatory Visit
Admission: RE | Admit: 2021-11-11 | Discharge: 2021-11-11 | Disposition: A | Discharge status: Home / Self Care | Payer: Self-pay | Source: Ambulatory Visit | Attending: Surgery | Admitting: Surgery

## 2021-11-11 ENCOUNTER — Other Ambulatory Visit: Payer: Self-pay

## 2021-11-11 NOTE — Patient Instructions (Addendum)
Your procedure is scheduled on: Tuesday November 19, 2021. Su procedimiento est programado para: Martes 7 de [place] del 2023. Report to Day Surgery inside [facility] 2nd floor, stop by admissions desk before getting on elevator. Presntese a: Diplomatic Services operational officer del [facility] 2do piso, antes de subir al elevador. To find out your arrival time please call [phone] between 1PM - 3PM on Monday November 18, 2021. Para saber su hora de llegada por favor llame al [phone] Eusebio Me la 1PM - 3PM el da: Lunes 6 de [place] del 2023.  Remember: Instructions that are not followed completely may result in serious medical risk, up to and including death,  or upon the discretion of your surgeon and anesthesiologist your surgery may need to be rescheduled.  Recuerde: Las instrucciones que no se siguen completamente Armed forces logistics/support/administrative officer en un riesgo de salud grave, incluyendo hasta  la [place] o a discrecin de su cirujano y Scientific laboratory technician, su ciruga se puede posponer.   __X_ 1.Do not eat food after midnight the night before your procedure. No    gum chewing or hard candies. You may drink clear liquids up to 2 hours     before you are scheduled to arrive for your surgery- DO not drink clear     Liquids within 2 hours of the start of your surgery.     Clear Liquids include:    -water, apple juice without pulp,  -Black Coffee or Tea (Do not add anything to coffee or tea).      No coma nada despus de la medianoche de la noche anterior a su    procedimiento. No coma chicles ni caramelos duros. Puede tomar    lquidos claros hasta 2 horas antes de su hora programada de llegada al     hospital para su procedimiento. No tome lquidos claros durante el     transcurso de las 2 horas de su llegada programada al hospital para su     procedimiento, ya que esto puede llevar a que su procedimiento se    retrase o tenga que volver a Magazine features editor.  Los lquidos claros incluyen:          - Agua o jugo de  [place] sin pulpa          - Caf negro o t claro (sin leche, sin cremas, no agregue nada al caf ni al t)  No tome nada que no est en esta lista.  Los pacientes con diabetes tipo 1 y tipo 2 solo deben Printmaker.  Llame a la clnica de PreCare o a la unidad de Same Day Surgery si  tiene alguna pregunta sobre estas instrucciones.              _X__ 2.Do Not Smoke or use e-cigarettes For 24 Hours Prior to Your Surgery.    Do not use any chewable tobacco products for at least 6   hours prior to surgery.    No fume ni use cigarrillos electrnicos durante las 24 horas previas    a su [place].  No use ningn producto de tabaco masticable durante   al menos 6 horas antes de la [place].     __X_ 3. No alcohol for 24 hours before or after surgery.    No tome alcohol durante las 24 horas antes ni despus de la [place].   __X__4. On the morning of surgery brush your teeth with toothpaste and water, you  may rinse your mouth with mouthwash if you wish.  Do not swallow any toothpaste of mouthwash.   En la maana de la [place], cepllese los dientes con pasta de dientes y [place],                [place] enjuagarse la boca con enjuague bucal si lo desea. No ingiera ninguna pasta de dientes o enjuague bucal.   __X__ 5. Notify your doctor if there is any change in your medical condition (cold,fever, infections).    Informe a su mdico si hay algn cambio en su condicin mdica  (resfriado, fiebre, infecciones).   Do not wear jewelry, make-up, hairpins, clips or nail polish.  No use joyas, maquillajes, pinzas/ganchos para el cabello ni esmalte de uas.  Do not wear lotions, powders, or perfumes. You may wear deodorant.  No use lociones, polvos o perfumes.  Puede usar desodorante.    Do not shave 48 hours prior to surgery. Men may shave face and neck.  No se afeite 48 horas antes de la [place].  Los hombres pueden [employer] cara  y el cuello.   Do not bring valuables to the hospital.    No lleve objetos de valor al hospital.  [facility] is not responsible for any belongings or valuables.  [place] no se hace responsable de ningn tipo de pertenencias u objetos de Licensed conveyancer.               Contacts, dentures or bridgework may not be worn into surgery.  Los lentes de [place], las dentaduras postizas o puentes no se pueden usar en la [place].   Leave your suitcase in the car. After surgery it may be brought to your room.  Deje su maleta en el auto.  Despus de la ciruga podr traerla a su habitacin.   For patients admitted to the hospital, discharge time is determined by your  treatment team.  Para los pacientes que sean ingresados al hospital, el tiempo en el cual se le  dar de alta es determinado por su equipo de [place].   Patients discharged the day of surgery will not be allowed to drive home. A los pacientes que se les da de alta el mismo da de la ciruga no se les permitir conducir a Higher education careers adviser.  __X__ Take these medicines the morning of surgery with A SIP OF WATER:          [employer] estas medicinas la maana de la ciruga con UN SORBO DE AGUA:  1. NIFEdipine (ADALAT CC) 60 MG   2.   3.   4.       5.  6.  ____ Fleet Enema (as directed)          Enema de Fleet (segn lo indicado)    __X__ Use CHG Soap as directed          Utilice el jabn de CHG segn lo indicado  ____ Use inhalers on the day of surgery          Use los inhaladores el da de la ciruga  ____ Stop metformin 2 days prior to surgery          Deje de tomar el metformin 2 das antes de la ciruga    ____ Take 1/2 of usual insulin dose the night before surgery and none on the morning of surgery           Tome la mitad de la dosis habitual de insulina la noche antes de  la [place] y no tome nada en la maana de la             [place]  __X__ Stop Anti-inflammatories such as Ibuprfen, Aleve, Advil, naporxen, Motrin, aspirin, Goody's and or BC powders.           Deje de tomar antiinflamatorios  como Ibuprfen, Aleve, Advil, naporxen, Motrin, aspirin, Goody's and or BC powders.    __X__ Do not start any vitamins and or herbal supplements until after surgery            No Empieze a tomar vitaminas or suplementos de hierbas hasta despus de la ciruga  ____ Bring C-Pap to the hospital          Lleve el C-Pap al hospital

## 2021-11-12 ENCOUNTER — Other Ambulatory Visit
Admission: RE | Admit: 2021-11-12 | Discharge: 2021-11-12 | Disposition: A | Discharge status: Home / Self Care | Payer: Self-pay | Source: Ambulatory Visit | Attending: Surgery | Admitting: Surgery

## 2021-11-12 DIAGNOSIS — Z01818 Encounter for other preprocedural examination: Secondary | ICD-10-CM | POA: Insufficient documentation

## 2021-11-12 DIAGNOSIS — I10 Essential (primary) hypertension: Secondary | ICD-10-CM | POA: Insufficient documentation

## 2021-11-18 MED ORDER — PHENYLEPHRINE HCL (PRESSORS) 10 MG/ML IV SOLN
INTRAVENOUS | Status: AC
  Filled 2021-11-18: qty 1

## 2021-11-18 MED ORDER — PROPOFOL 500 MG/50ML IV EMUL
INTRAVENOUS | Status: AC
  Filled 2021-11-18: qty 100

## 2021-11-18 MED ORDER — LIDOCAINE HCL (PF) 2 % IJ SOLN
INTRAMUSCULAR | Status: AC
  Filled 2021-11-18: qty 5

## 2021-11-18 NOTE — Anesthesia Preprocedure Evaluation (Addendum)
Anesthesia Evaluation  Patient identified by MRN, date of birth, ID band Patient awake    Reviewed: Allergy & Precautions, NPO status , Patient's Chart, lab work & pertinent test results  History of Anesthesia Complications Negative for: history of anesthetic complications  Airway Mallampati: IV   Neck ROM: Full    Dental no notable dental hx.    Pulmonary neg pulmonary ROS,    Pulmonary exam normal breath sounds clear to auscultation       Cardiovascular hypertension, Normal cardiovascular exam Rhythm:Regular Rate:Normal  ECG 11/12/21: normal   Neuro/Psych  Headaches, PSYCHIATRIC DISORDERS Depression Nerve damage left hand    GI/Hepatic Cholecystitis    Endo/Other  diabetesHx gestational DM; obesity  Renal/GU Renal disease (stage I CKD)     Musculoskeletal   Abdominal   Peds  Hematology negative hematology ROS (+)   Anesthesia Other Findings   Reproductive/Obstetrics                           Anesthesia Physical Anesthesia Plan  ASA: 2  Anesthesia Plan: General   Post-op Pain Management:    Induction: Intravenous  PONV Risk Score and Plan: 3 and Ondansetron, Dexamethasone and Treatment may vary due to age or medical condition  Airway Management Planned: Oral ETT  Additional Equipment:   Intra-op Plan:   Post-operative Plan: Extubation in OR  Informed Consent: I have reviewed the patients History and Physical, chart, labs and discussed the procedure including the risks, benefits and alternatives for the proposed anesthesia with the patient or authorized representative who has indicated his/her understanding and acceptance.     Dental advisory given  Plan Discussed with: CRNA  Anesthesia Plan Comments: (Consent obtained in [place].  Patient consented for risks of anesthesia including but not limited to:  - adverse reactions to medications - damage to eyes, teeth, lips or  other oral mucosa - nerve damage due to positioning  - sore throat or hoarseness - damage to heart, brain, nerves, lungs, other parts of body or loss of life  Informed patient about role of CRNA in peri- and intra-operative care.  Patient voiced understanding.)        Anesthesia Quick Evaluation

## 2021-11-19 ENCOUNTER — Ambulatory Visit: Payer: Self-pay | Admitting: Anesthesiology

## 2021-11-19 ENCOUNTER — Other Ambulatory Visit: Payer: Self-pay

## 2021-11-19 ENCOUNTER — Encounter
Admission: RE | Disposition: A | Discharge status: Home / Self Care | Payer: Self-pay | Source: Home / Self Care | Attending: Surgery

## 2021-11-19 ENCOUNTER — Encounter: Payer: Self-pay | Admitting: Surgery

## 2021-11-19 ENCOUNTER — Ambulatory Visit
Admission: RE | Admit: 2021-11-19 | Discharge: 2021-11-19 | Disposition: A | Discharge status: Home / Self Care | Payer: Self-pay | Attending: Surgery | Admitting: Surgery

## 2021-11-19 DIAGNOSIS — K819 Cholecystitis, unspecified: Secondary | ICD-10-CM

## 2021-11-19 DIAGNOSIS — E669 Obesity, unspecified: Secondary | ICD-10-CM | POA: Insufficient documentation

## 2021-11-19 DIAGNOSIS — Z6834 Body mass index (BMI) 34.0-34.9, adult: Secondary | ICD-10-CM | POA: Insufficient documentation

## 2021-11-19 DIAGNOSIS — N181 Chronic kidney disease, stage 1: Secondary | ICD-10-CM | POA: Insufficient documentation

## 2021-11-19 DIAGNOSIS — K8010 Calculus of gallbladder with chronic cholecystitis without obstruction: Secondary | ICD-10-CM

## 2021-11-19 DIAGNOSIS — I129 Hypertensive chronic kidney disease with stage 1 through stage 4 chronic kidney disease, or unspecified chronic kidney disease: Secondary | ICD-10-CM | POA: Insufficient documentation

## 2021-11-19 DIAGNOSIS — K429 Umbilical hernia without obstruction or gangrene: Secondary | ICD-10-CM

## 2021-11-19 DIAGNOSIS — K8012 Calculus of gallbladder with acute and chronic cholecystitis without obstruction: Secondary | ICD-10-CM | POA: Insufficient documentation

## 2021-11-19 HISTORY — PX: UMBILICAL HERNIA REPAIR: SHX196

## 2021-11-19 LAB — POCT PREGNANCY, URINE: Preg Test, Ur: NEGATIVE

## 2021-11-19 SURGERY — CHOLECYSTECTOMY, ROBOT-ASSISTED, LAPAROSCOPIC
Anesthesia: General | Site: Abdomen

## 2021-11-19 MED ORDER — FENTANYL CITRATE (PF) 100 MCG/2ML IJ SOLN: 25.0000 ug | INTRAMUSCULAR | Status: DC | PRN

## 2021-11-19 MED ORDER — CHLORHEXIDINE GLUCONATE 0.12 % MT SOLN: 15.0000 mL | Freq: Once | OROMUCOSAL | Status: AC

## 2021-11-19 MED ORDER — IBUPROFEN 800 MG PO TABS: 800.0000 mg | ORAL_TABLET | Freq: Three times a day (TID) | ORAL | 1 refills | Status: AC | PRN

## 2021-11-19 MED ORDER — HYDROMORPHONE HCL 1 MG/ML IJ SOLN
INTRAMUSCULAR | Status: DC | PRN
Start: 2021-11-19 — End: 2021-11-19
  Administered 2021-11-19 (×2): .5 mg via INTRAVENOUS

## 2021-11-19 MED ORDER — BUPIVACAINE-EPINEPHRINE (PF) 0.25% -1:200000 IJ SOLN
INTRAMUSCULAR | Status: AC
  Filled 2021-11-19: qty 30

## 2021-11-19 MED ORDER — DEXMEDETOMIDINE HCL IN NACL 200 MCG/50ML IV SOLN
INTRAVENOUS | Status: DC | PRN
  Administered 2021-11-19: 8 ug via INTRAVENOUS
  Administered 2021-11-19: 12 ug via INTRAVENOUS

## 2021-11-19 MED ORDER — SODIUM CHLORIDE 0.9 % IV SOLN
INTRAVENOUS | Status: AC | PRN
  Administered 2021-11-19: 20 mL

## 2021-11-19 MED ORDER — FAMOTIDINE 20 MG PO TABS
ORAL_TABLET | ORAL | Status: AC
  Administered 2021-11-19: 20 mg via ORAL
  Filled 2021-11-19: qty 1

## 2021-11-19 MED ORDER — PHENYLEPHRINE HCL (PRESSORS) 10 MG/ML IV SOLN
INTRAVENOUS | Status: DC | PRN
  Administered 2021-11-19 (×2): 80 ug via INTRAVENOUS
  Administered 2021-11-19: 60 ug via INTRAVENOUS
  Administered 2021-11-19 (×2): 160 ug via INTRAVENOUS

## 2021-11-19 MED ORDER — CHLORHEXIDINE GLUCONATE 0.12 % MT SOLN
OROMUCOSAL | Status: AC
  Administered 2021-11-19: 15 mL via OROMUCOSAL
  Filled 2021-11-19: qty 15

## 2021-11-19 MED ORDER — SEVOFLURANE IN SOLN
RESPIRATORY_TRACT | Status: AC
  Filled 2021-11-19: qty 250

## 2021-11-19 MED ORDER — ACETAMINOPHEN 500 MG PO TABS
ORAL_TABLET | ORAL | Status: AC
  Administered 2021-11-19: 1000 mg via ORAL
  Filled 2021-11-19: qty 2

## 2021-11-19 MED ORDER — CHLORHEXIDINE GLUCONATE CLOTH 2 % EX PADS: 6.0000 | MEDICATED_PAD | Freq: Once | CUTANEOUS | Status: DC

## 2021-11-19 MED ORDER — ONDANSETRON HCL 4 MG/2ML IJ SOLN: 4.0000 mg | Freq: Once | INTRAMUSCULAR | Status: DC | PRN

## 2021-11-19 MED ORDER — LACTATED RINGERS IV SOLN: INTRAVENOUS | Status: DC

## 2021-11-19 MED ORDER — OXYCODONE HCL 5 MG/5ML PO SOLN: 5.0000 mg | Freq: Once | ORAL | Status: DC | PRN

## 2021-11-19 MED ORDER — GABAPENTIN 300 MG PO CAPS
ORAL_CAPSULE | ORAL | Status: AC
  Administered 2021-11-19: 300 mg via ORAL
  Filled 2021-11-19: qty 1

## 2021-11-19 MED ORDER — PHENYLEPHRINE HCL (PRESSORS) 10 MG/ML IV SOLN
INTRAVENOUS | Status: AC
  Filled 2021-11-19: qty 1

## 2021-11-19 MED ORDER — INSULIN ASPART 100 UNIT/ML IJ SOLN
INTRAMUSCULAR | Status: AC
  Filled 2021-11-19: qty 1

## 2021-11-19 MED ORDER — BUPIVACAINE-EPINEPHRINE (PF) 0.25% -1:200000 IJ SOLN
INTRAMUSCULAR | Status: DC | PRN
  Administered 2021-11-19: 30 mL

## 2021-11-19 MED ORDER — ONDANSETRON HCL 4 MG/2ML IJ SOLN
INTRAMUSCULAR | Status: DC | PRN
  Administered 2021-11-19: 4 mg via INTRAVENOUS

## 2021-11-19 MED ORDER — PROPOFOL 500 MG/50ML IV EMUL
INTRAVENOUS | Status: AC
  Filled 2021-11-19: qty 50

## 2021-11-19 MED ORDER — OXYCODONE HCL 5 MG PO TABS: 5.0000 mg | ORAL_TABLET | Freq: Once | ORAL | Status: DC | PRN

## 2021-11-19 MED ORDER — GABAPENTIN 300 MG PO CAPS: 300.0000 mg | ORAL_CAPSULE | ORAL | Status: AC

## 2021-11-19 MED ORDER — KETOROLAC TROMETHAMINE 30 MG/ML IJ SOLN
INTRAMUSCULAR | Status: DC | PRN
  Administered 2021-11-19: 15 mg via INTRAVENOUS

## 2021-11-19 MED ORDER — PHENYLEPHRINE HCL-NACL 20-0.9 MG/250ML-% IV SOLN
INTRAVENOUS | Status: DC | PRN
  Administered 2021-11-19: 60 ug/min via INTRAVENOUS

## 2021-11-19 MED ORDER — MIDAZOLAM HCL 2 MG/2ML IJ SOLN
INTRAMUSCULAR | Status: AC
  Filled 2021-11-19: qty 2

## 2021-11-19 MED ORDER — FENTANYL CITRATE (PF) 100 MCG/2ML IJ SOLN
INTRAMUSCULAR | Status: AC
  Filled 2021-11-19: qty 2

## 2021-11-19 MED ORDER — ACETAMINOPHEN 500 MG PO TABS
1000.0000 mg | ORAL_TABLET | Freq: Four times a day (QID) | ORAL | Status: AC | PRN
Start: 2021-11-19 — End: ?

## 2021-11-19 MED ORDER — ROCURONIUM BROMIDE 100 MG/10ML IV SOLN
INTRAVENOUS | Status: DC | PRN
  Administered 2021-11-19: 10 mg via INTRAVENOUS
  Administered 2021-11-19: 20 mg via INTRAVENOUS
  Administered 2021-11-19: 50 mg via INTRAVENOUS
  Administered 2021-11-19: 20 mg via INTRAVENOUS

## 2021-11-19 MED ORDER — ACETAMINOPHEN 500 MG PO TABS: 1000.0000 mg | ORAL_TABLET | ORAL | Status: AC

## 2021-11-19 MED ORDER — ORAL CARE MOUTH RINSE: 15.0000 mL | Freq: Once | OROMUCOSAL | Status: AC

## 2021-11-19 MED ORDER — KETAMINE HCL 10 MG/ML IJ SOLN
INTRAMUSCULAR | Status: DC | PRN
  Administered 2021-11-19: 20 mg via INTRAVENOUS
  Administered 2021-11-19: 30 mg via INTRAVENOUS

## 2021-11-19 MED ORDER — SUGAMMADEX SODIUM 200 MG/2ML IV SOLN
INTRAVENOUS | Status: DC | PRN
  Administered 2021-11-19: 200 mg via INTRAVENOUS

## 2021-11-19 MED ORDER — EPHEDRINE SULFATE (PRESSORS) 50 MG/ML IJ SOLN
INTRAMUSCULAR | Status: DC | PRN
  Administered 2021-11-19: 10 mg via INTRAVENOUS

## 2021-11-19 MED ORDER — OXYCODONE HCL 5 MG PO TABS: 5.0000 mg | ORAL_TABLET | ORAL | 0 refills | Status: DC | PRN

## 2021-11-19 MED ORDER — ACETAMINOPHEN 10 MG/ML IV SOLN: 1000.0000 mg | Freq: Once | INTRAVENOUS | Status: DC | PRN

## 2021-11-19 MED ORDER — FAMOTIDINE 20 MG PO TABS
20.0000 mg | ORAL_TABLET | Freq: Once | ORAL | Status: AC
Start: 2021-11-19 — End: 2021-11-19

## 2021-11-19 MED ORDER — INDOCYANINE GREEN 25 MG IV SOLR
2.5000 mg | INTRAVENOUS | Status: AC
  Administered 2021-11-19: 2.5 mg via INTRAVENOUS
  Filled 2021-11-19: qty 1

## 2021-11-19 MED ORDER — ACETAMINOPHEN 10 MG/ML IV SOLN
INTRAVENOUS | Status: AC
  Filled 2021-11-19: qty 100

## 2021-11-19 MED ORDER — ALBUTEROL SULFATE HFA 108 (90 BASE) MCG/ACT IN AERS
INHALATION_SPRAY | RESPIRATORY_TRACT | Status: DC | PRN
Start: 2021-11-19 — End: 2021-11-19
  Administered 2021-11-19 (×2): 8 via RESPIRATORY_TRACT

## 2021-11-19 MED ORDER — PROPOFOL 500 MG/50ML IV EMUL
INTRAVENOUS | Status: AC
  Filled 2021-11-19: qty 100

## 2021-11-19 MED ORDER — CEFAZOLIN SODIUM-DEXTROSE 2-4 GM/100ML-% IV SOLN
2.0000 g | INTRAVENOUS | Status: AC
  Administered 2021-11-19: 2 g via INTRAVENOUS

## 2021-11-19 MED ORDER — KETAMINE HCL 50 MG/5ML IJ SOSY
PREFILLED_SYRINGE | INTRAMUSCULAR | Status: AC
  Filled 2021-11-19: qty 5

## 2021-11-19 MED ORDER — BUPIVACAINE-EPINEPHRINE (PF) 0.5% -1:200000 IJ SOLN
INTRAMUSCULAR | Status: AC
  Filled 2021-11-19: qty 30

## 2021-11-19 MED ORDER — HYDROMORPHONE HCL 1 MG/ML IJ SOLN
INTRAMUSCULAR | Status: AC
  Filled 2021-11-19: qty 1

## 2021-11-19 MED ORDER — LIDOCAINE HCL (CARDIAC) PF 100 MG/5ML IV SOSY
PREFILLED_SYRINGE | INTRAVENOUS | Status: DC | PRN
  Administered 2021-11-19: 60 mg via INTRAVENOUS

## 2021-11-19 MED ORDER — CEFAZOLIN SODIUM-DEXTROSE 2-4 GM/100ML-% IV SOLN
INTRAVENOUS | Status: AC
  Filled 2021-11-19: qty 100

## 2021-11-19 MED ORDER — CHLORHEXIDINE GLUCONATE CLOTH 2 % EX PADS
6.0000 | MEDICATED_PAD | Freq: Once | CUTANEOUS | Status: AC
  Administered 2021-11-19: 6 via TOPICAL

## 2021-11-19 MED ORDER — DEXAMETHASONE SODIUM PHOSPHATE 10 MG/ML IJ SOLN
INTRAMUSCULAR | Status: DC | PRN
  Administered 2021-11-19: 10 mg via INTRAVENOUS

## 2021-11-19 SURGICAL SUPPLY — 64 items
BAG INFUSER PRESSURE 100CC (MISCELLANEOUS) ×1 IMPLANT
BLADE SURG 15 STRL LF DISP TIS (BLADE) ×1 IMPLANT
BLADE SURG 15 STRL SS (BLADE) ×1
CANNULA REDUC XI 12-8 STAPL (CANNULA) ×1
CANNULA REDUCER 12-8 DVNC XI (CANNULA) ×1 IMPLANT
CLIP LIGATING HEMO O LOK GREEN (MISCELLANEOUS) ×2 IMPLANT
CUP MEDICINE 2OZ PLAST GRAD ST (MISCELLANEOUS) ×2 IMPLANT
DERMABOND ADVANCED (GAUZE/BANDAGES/DRESSINGS) ×1
DERMABOND ADVANCED .7 DNX12 (GAUZE/BANDAGES/DRESSINGS) ×1 IMPLANT
DRAPE ARM DVNC X/XI (DISPOSABLE) ×4 IMPLANT
DRAPE COLUMN DVNC XI (DISPOSABLE) ×1 IMPLANT
DRAPE DA VINCI XI ARM (DISPOSABLE) ×4
DRAPE DA VINCI XI COLUMN (DISPOSABLE) ×1
DRAPE LAPAROTOMY 77X122 PED (DRAPES) ×2 IMPLANT
ELECT CAUTERY BLADE TIP 2.5 (TIP) ×2
ELECT REM PT RETURN 9FT ADLT (ELECTROSURGICAL) ×2
ELECTRODE CAUTERY BLDE TIP 2.5 (TIP) ×1 IMPLANT
ELECTRODE REM PT RTRN 9FT ADLT (ELECTROSURGICAL) ×1 IMPLANT
GLOVE SURG SYN 7.0 (GLOVE) ×4 IMPLANT
GLOVE SURG SYN 7.0 PF PI (GLOVE) ×2 IMPLANT
GLOVE SURG SYN 7.5  E (GLOVE) ×2
GLOVE SURG SYN 7.5 E (GLOVE) ×2 IMPLANT
GLOVE SURG SYN 7.5 PF PI (GLOVE) ×2 IMPLANT
GOWN STRL REUS W/ TWL LRG LVL3 (GOWN DISPOSABLE) ×4 IMPLANT
GOWN STRL REUS W/TWL LRG LVL3 (GOWN DISPOSABLE) ×4
IRRIGATOR SUCT 8 DISP DVNC XI (IRRIGATION / IRRIGATOR) IMPLANT
IRRIGATOR SUCTION 8MM XI DISP (IRRIGATION / IRRIGATOR) ×1
IV NS 1000ML (IV SOLUTION) ×1
IV NS 1000ML BAXH (IV SOLUTION) IMPLANT
KIT PINK PAD W/HEAD ARE REST (MISCELLANEOUS) ×2
KIT PINK PAD W/HEAD ARM REST (MISCELLANEOUS) ×1 IMPLANT
MANIFOLD NEPTUNE II (INSTRUMENTS) ×2 IMPLANT
NEEDLE HYPO 22GX1.5 SAFETY (NEEDLE) ×2 IMPLANT
NS IRRIG 500ML POUR BTL (IV SOLUTION) ×2 IMPLANT
OBTURATOR OPTICAL STANDARD 8MM (TROCAR) ×1
OBTURATOR OPTICAL STND 8 DVNC (TROCAR) ×1
OBTURATOR OPTICALSTD 8 DVNC (TROCAR) ×1 IMPLANT
PACK BASIN MINOR ARMC (MISCELLANEOUS) ×2 IMPLANT
PACK LAP CHOLECYSTECTOMY (MISCELLANEOUS) ×2 IMPLANT
PENCIL ELECTRO HAND CTR (MISCELLANEOUS) ×2 IMPLANT
POUCH SPECIMEN RETRIEVAL 10MM (ENDOMECHANICALS) ×2 IMPLANT
RELOAD STAPLE 45 3.5 BLU DVNC (STAPLE) IMPLANT
RELOAD STAPLER 3.5X45 BLU DVNC (STAPLE) ×1 IMPLANT
SEAL CANN UNIV 5-8 DVNC XI (MISCELLANEOUS) ×3 IMPLANT
SEAL XI 5MM-8MM UNIVERSAL (MISCELLANEOUS) ×3
SET TUBE SMOKE EVAC HIGH FLOW (TUBING) ×2 IMPLANT
SOLUTION ELECTROLUBE (MISCELLANEOUS) ×2 IMPLANT
STAPLER 45 DA VINCI SURE FORM (STAPLE) ×1
STAPLER 45 SUREFORM DVNC (STAPLE) IMPLANT
STAPLER CANNULA SEAL DVNC XI (STAPLE) ×1 IMPLANT
STAPLER CANNULA SEAL XI (STAPLE) ×1
STAPLER RELOAD 3.5X45 BLU DVNC (STAPLE) ×1
STAPLER RELOAD 3.5X45 BLUE (STAPLE) ×1
SUT ETHIBOND 0 MO6 C/R (SUTURE) ×2 IMPLANT
SUT MNCRL AB 4-0 PS2 18 (SUTURE) ×3 IMPLANT
SUT VIC AB 2-0 SH 27 (SUTURE) ×2
SUT VIC AB 2-0 SH 27XBRD (SUTURE) ×1 IMPLANT
SUT VIC AB 3-0 SH 27 (SUTURE) ×1
SUT VIC AB 3-0 SH 27X BRD (SUTURE) ×1 IMPLANT
SUT VICRYL 0 AB UR-6 (SUTURE) ×4 IMPLANT
SYR 20ML LL LF (SYRINGE) ×2 IMPLANT
SYR BULB IRRIG 60ML STRL (SYRINGE) ×2 IMPLANT
TAPE TRANSPORE STRL 2 31045 (GAUZE/BANDAGES/DRESSINGS) ×2 IMPLANT
TROCAR BALLN GELPORT 12X130M (ENDOMECHANICALS) ×2 IMPLANT

## 2021-11-19 NOTE — Op Note (Signed)
Procedure Date:  11/19/2021  Pre-operative Diagnosis:  History of Cholecystitis, umbilical hernia  Post-operative Diagnosis: Cholecystitis, umbilical hernia 3 cm, reducible.  Procedure:  Robotic assisted cholecystectomy with ICG FireFly cholangiogram; open umbilical hernia repair.  Surgeon:  Howie Ill, MD  Assistant:  Darrick Penna, PA-S  Anesthesia:  General endotracheal  Estimated Blood Loss:  15 ml  Specimens:  gallbladder  Complications:  None  Indications for Procedure:  This is a 36 y.o. female who presents with abdominal pain and workup revealing cholecystitis treated conservatively.  She also has an umbilical hernia.  The benefits, complications, treatment options, and expected outcomes were discussed with the patient. The risks of bleeding, infection, recurrence of symptoms, failure to resolve symptoms, bile duct damage, bile duct leak, retained common bile duct stone, bowel injury, and need for further procedures were all discussed with the patient and she was willing to proceed.  Description of Procedure: The patient was correctly identified in the preoperative area and brought into the operating room.  The patient was placed supine with VTE prophylaxis in place.  Appropriate time-outs were performed.  Anesthesia was induced and the patient was intubated.  Appropriate antibiotics were infused.  The abdomen was prepped and draped in a sterile fashion. An infraumbilical incision was made. Cautery was used to dissect down the umbilical stalk and to detach the stalk from the fascia, revealing a 3 cm umbilical hernia defect.  The fascial edges were cleared and a 12 mm robotic port was inserted.  Pneumoperitoneum was obtained with appropriate opening pressures.  Three 8-mm ports were placed in the mid abdomen at the level of the umbilicus under direct visualization.  The DaVinci platform was docked, camera targeted, and instruments were placed under direct visualization.  The  gallbladder was identified.  The fundus was grasped and retracted cephalad.  Adhesions were lysed bluntly and with electrocautery. The infundibulum was grasped and retracted laterally, exposing the peritoneum overlying the gallbladder.  This was incised with electrocautery and extended on either side of the gallbladder.  FireFly cholangiogram was then obtained, and we were able to clearly identify the common bile duct but no ICG was flowing into the cystic duct or gallbladder, consistent with her history of cholecystitis.  The cystic duct and cystic artery were carefully dissected with combination of cautery and blunt dissection.  The cystic duct was very dilated.  The artery was clipped twice proximally and once distally, cutting in between.  The duct was stapled across using a blue load 45 mm stapler.  The common bile duct was then again visualized and without any interruption in ICG flow.  The gallbladder was taken from the gallbladder fossa in a retrograde fashion with electrocautery. The gallbladder was placed in an Endocatch bag. The liver bed was inspected and any bleeding was controlled with electrocautery. The right upper quadrant was then inspected again revealing intact clips, no bleeding, and no ductal injury.  The area was thoroughly irrigated.  The 8 mm ports were removed under direct visualization and the 12 mm port was removed.  The Endocatch bag was brought out via the umbilical incision. The hernia defect was closed using multiple 0 vicryl sutures.  Local anesthetic was infused in all incisions.  The stalk was reattached using 2-0 Vicryl, and the umbilical incision was closed using 3-0 Vicryl and 4-0 Monocryl.  The remaining port incisions were closed with 4-0 Monocryl.  The wounds were cleaned and sealed with DermaBond.  The patient was emerged from anesthesia  and extubated and brought to the recovery room for further management.  The patient tolerated the procedure well and all counts were  correct at the end of the case.   Howie Ill, MD

## 2021-11-19 NOTE — Anesthesia Procedure Notes (Signed)
Procedure Name: Intubation Date/Time: 11/19/2021 7:42 AM Performed by: Kerri Perches, CRNA Pre-anesthesia Checklist: Patient identified, Patient being monitored, Timeout performed, Emergency Drugs available and Suction available Patient Re-evaluated:Patient Re-evaluated prior to induction Oxygen Delivery Method: Circle system utilized Preoxygenation: Pre-oxygenation with 100% oxygen Induction Type: IV induction Ventilation: Mask ventilation without difficulty Laryngoscope Size: Mac and 3 Grade View: Grade I Tube type: Oral Tube size: 6.5 mm Number of attempts: 1 Airway Equipment and Method: Stylet Placement Confirmation: ETT inserted through vocal cords under direct vision, positive ETCO2 and breath sounds checked- equal and bilateral Secured at: 20 cm Tube secured with: Tape Dental Injury: Teeth and Oropharynx as per pre-operative assessment

## 2021-11-19 NOTE — Transfer of Care (Addendum)
Immediate Anesthesia Transfer of Care Note  Patient: Sandra Maynard  Procedure(s) Performed: XI ROBOTIC ASSISTED LAPAROSCOPIC CHOLECYSTECTOMY (Abdomen) INDOCYANINE GREEN FLUORESCENCE IMAGING (ICG) (Abdomen) HERNIA REPAIR UMBILICAL ADULT, open (Abdomen)  Patient Location: PACU  Anesthesia Type:General  Level of Consciousness: drowsy  Airway & Oxygen Therapy: Patient Spontanous Breathing  Post-op Assessment: Report given to RN  Post vital signs: stable  Last Vitals:  Vitals Value Taken Time  BP    Temp    Pulse    Resp    SpO2      Last Pain:  Vitals:   11/19/21 0635  TempSrc: Temporal  PainSc: 0-No pain         Complications: No notable events documented.

## 2021-11-19 NOTE — Anesthesia Postprocedure Evaluation (Signed)
Anesthesia Post Note  Patient: Sandra Maynard  Procedure(s) Performed: XI ROBOTIC ASSISTED LAPAROSCOPIC CHOLECYSTECTOMY (Abdomen) INDOCYANINE GREEN FLUORESCENCE IMAGING (ICG) (Abdomen) HERNIA REPAIR UMBILICAL ADULT, open (Abdomen)  Patient location during evaluation: PACU Anesthesia Type: General Level of consciousness: awake and alert, oriented and patient cooperative Pain management: pain level controlled Vital Signs Assessment: post-procedure vital signs reviewed and stable Respiratory status: spontaneous breathing, nonlabored ventilation and respiratory function stable Cardiovascular status: blood pressure returned to baseline and stable Postop Assessment: adequate PO intake Anesthetic complications: no   No notable events documented.   Last Vitals:  Vitals:   11/19/21 1030 11/19/21 1055  BP: (!) 147/100 128/89  Pulse: 98 88  Resp: 12 16  Temp: (!) 36.4 C (!) 36.1 C  SpO2: 98% 95%    Last Pain:  Vitals:   11/19/21 1111  TempSrc:   PainSc: 1                  Reed Breech

## 2021-11-19 NOTE — Progress Notes (Signed)
Pt BP 147/100. This RN spoke with Dr. Ronni Rumble. Stated this was 20% of baseline and pt would be ok to go home.

## 2021-11-19 NOTE — Interval H&P Note (Signed)
History and Physical Interval Note:  11/19/2021 7:13 AM  Sandra Maynard  has presented today for surgery, with the diagnosis of symtomatic cholelithiasis  umbilical hernia.  The various methods of treatment have been discussed with the patient and family. After consideration of risks, benefits and other options for treatment, the patient has consented to  Procedure(s): XI ROBOTIC ASSISTED LAPAROSCOPIC CHOLECYSTECTOMY (N/A) INDOCYANINE GREEN FLUORESCENCE IMAGING (ICG) (N/A) HERNIA REPAIR UMBILICAL ADULT, open (N/A) as a surgical intervention.  The patient's history has been reviewed, patient examined, no change in status, stable for surgery.  I have reviewed the patient's chart and labs.  Questions were answered to the patient's satisfaction.     Trace Cederberg

## 2021-11-19 NOTE — Discharge Instructions (Addendum)
CIRUGIA AMBULATORIA       Instruccionnes de alta   1.  Las drogas que se Dispensing optician en su cuerpo [employer], asi            que por las proximas 24 horas usted no debe:   Conducir Field seismologist) un automovil   Hacer ninguna decision legal   Tomar ninguna bebida alcoholica  2.  A) [place] puede comenzar una dieta regular.  Es mejor que hoy empiece con           liquidos y gradualmente anada [address]       B) Puede comer cualquier comida que desee pero es mejor empezar con liquidos,                      luego sopitas con galletas saladas y gradualmente llegar a las comidas solidas.  3.  Por favor avise a su medico inmediatamente si usted tiene algun sangrado anormal,       tiene dificultad con la respiracion, enrojecimiento y Engineer, mining en el sitio de la cirugia, [place],       fiebro o dolor que se alivia con [place].           5.  Istrucciones especificas :

## 2021-11-20 LAB — SURGICAL PATHOLOGY

## 2021-12-04 ENCOUNTER — Ambulatory Visit (INDEPENDENT_AMBULATORY_CARE_PROVIDER_SITE_OTHER): Payer: Self-pay | Admitting: Surgery

## 2021-12-04 ENCOUNTER — Encounter: Payer: Self-pay | Admitting: Surgery

## 2021-12-04 ENCOUNTER — Other Ambulatory Visit: Payer: Self-pay

## 2021-12-04 VITALS — BP 154/89 | HR 84 | Temp 98.0°F | Ht 60.0 in | Wt 180.0 lb

## 2021-12-04 DIAGNOSIS — Z09 Encounter for follow-up examination after completed treatment for conditions other than malignant neoplasm: Secondary | ICD-10-CM

## 2021-12-04 DIAGNOSIS — K819 Cholecystitis, unspecified: Secondary | ICD-10-CM

## 2021-12-04 NOTE — Progress Notes (Signed)
12/04/2021  HPI: Sandra Maynard is a 36 y.o. female s/p robotic assisted cholecystectomy and open umbilical hernia repair on 11/19/21.  Patient presents for follow up.  She reports she's been doing well, without any pain issues, nausea, etc.  Tolerating diet, having bowel function.  Vital signs: BP (!) 154/89    Pulse 84    Temp 98 F (36.7 C) (Oral)    Ht 5' (1.524 m)    Wt 180 lb (81.6 kg)    LMP 11/06/2021 (Approximate)    SpO2 99%    BMI 35.15 kg/m    Physical Exam: Constitutional:  No acute distress Abdomen:  soft, non-distended, non-tender to palpation.  Incisions healing well, clean, dry, intact.  No evidence of hernia recurrence.  Assessment/Plan: This is a 36 y.o. female s/p robotic assisted cholecystectomy and open umbilical hernia repair.  --Patient doing well, without any complications at this point.  Incisions healing without hernia recurrence. --Discussed two more weeks of activity restrictions. --Follow up as needed.   Howie Ill, MD [facility]

## 2021-12-04 NOTE — Patient Instructions (Signed)

## 2022-01-13 ENCOUNTER — Encounter: Payer: Self-pay | Admitting: Nurse Practitioner

## 2022-01-13 ENCOUNTER — Ambulatory Visit: Payer: Self-pay | Admitting: Nurse Practitioner

## 2022-01-13 DIAGNOSIS — N898 Other specified noninflammatory disorders of vagina: Secondary | ICD-10-CM

## 2022-01-13 DIAGNOSIS — Z113 Encounter for screening for infections with a predominantly sexual mode of transmission: Secondary | ICD-10-CM

## 2022-01-13 LAB — WET PREP FOR TRICH, YEAST, CLUE
Trichomonas Exam: NEGATIVE
Yeast Exam: NEGATIVE

## 2022-01-13 MED ORDER — CLOTRIMAZOLE 1 % VA CREA: 1.0000 | TOPICAL_CREAM | Freq: Every day | VAGINAL | 0 refills | Status: AC

## 2022-01-13 NOTE — Progress Notes (Signed)
[facility] Department ? ?STI clinic/screening visit ?[place] [address] ?[phone] ? ?Subjective:  ?Sandra Maynard is a 36 y.o. female being seen today for an STI screening visit. The patient reports they do have symptoms.  Patient reports that they do not desire a pregnancy in the next year.   They reported they are not interested in discussing contraception today.  History of tubal ligation.   ? ?Patient's last menstrual period was 01/04/2022. ? ? ?Patient has the following medical conditions:   ?Patient Active Problem List  ? Diagnosis Date Noted  ? Umbilical hernia without obstruction and without gangrene   ? Cholecystitis   ? Hx of tubal ligation 05/29/21 07/17/2021  ? Admission for sterilization   ? Preeclampsia--severe   Procardia 60 mg XL daily 05/24/2021  ? Gestational diabetes 04/26/21 04/29/2021  ? UTI (urinary tract infection) during pregnancy dx'd [facility] ER on 01/29/21 02/08/2021  ? Poor historian 02/08/2021  ? Proteinuria affecting pregnancy 12/11/20 spot protein/creat ratio=2,485 12/14/2020  ? Obesity affecting pregnancy BMI=35.6 12/11/2020  ? Supervision of high risk pregnancy in second trimester 12/11/2020  ? Late prenatal care 14 3/7 12/11/2020  ? History of preterm delivery 12/01/13 34 wks & 02/07/15 at 36 12/11/2020  ? Low birth weight infant 5 lbs 02/07/15 12/11/2020  ? Hx macrosomic infant 9 lbs 06/17/11 12/11/2020  ? Depression affecting pregnancy 12/11/2020  ? Hemorrhage after delivery of fetus on 06/17/11 500 cc 12/11/2020  ? Nerve damage to left hand as a child 12/11/2020  ? Low-level of literacy 6th grade 12/06/2014  ? History of cesarean section (flat/narrow AP pelvis per Dr. Amalia Hailey) 08/10/2014  ? ? ?Chief Complaint  ?Patient presents with  ? SEXUALLY TRANSMITTED DISEASE  ?  Screening  ? ? ?HPI ? ?Patient reports to clinic today for an STD screening.  Patient reports genital itching and burning during urination for 4 days.  ? ?Last HIV test per  patient/review of record was 05/29/21 ?Patient reports last pap was 12/11/20.  ? ?Screening for MPX risk: ?Does the patient have an unexplained rash? No ?Is the patient MSM? No ?Does the patient endorse multiple sex partners or anonymous sex partners? No ?Did the patient have close or sexual contact with a person diagnosed with MPX? No ?Has the patient traveled outside the [place] where MPX is endemic? No ?Is there a high clinical suspicion for MPX-- evidenced by one of the following No ? -Unlikely to be chickenpox ? -Lymphadenopathy ? -Rash that present in same phase of evolution on any given body part ?See flowsheet for further details and programmatic requirements.  ? ? ?The following portions of the patient's history were reviewed and updated as appropriate: allergies, current medications, past medical history, past social history, past surgical history and problem list. ? ?Objective:  ?There were no vitals filed for this visit. ? ?Physical Exam ?Constitutional:   ?   Appearance: Normal appearance.  ?HENT:  ?   Head: Normocephalic.  ?   Right Ear: External ear normal.  ?   Left Ear: External ear normal.  ?   Nose: Nose normal.  ?   Mouth/Throat:  ?   Mouth: Mucous membranes are moist.  ?   Comments: No visible signs of dental caries ?Pulmonary:  ?   Effort: Pulmonary effort is normal.  ?Abdominal:  ?   General: Abdomen is flat.  ?   Palpations: Abdomen is soft.  ?Genitourinary: ?   Comments: External genitalia/pubic area without nits, lice,  edema, erythema, lesions and inguinal adenopathy. ?Vagina with normal mucosa and discharge. ?Cervix without visible lesions. ?Uterus firm, mobile, nt, no masses, no CMT, no adnexal tenderness or fullness. pH 4.5. ?Musculoskeletal:  ?   Cervical back: Full passive range of motion without pain, normal range of motion and neck supple.  ?Skin: ?   General: Skin is warm and dry.  ?Neurological:  ?   Mental Status: She is alert and oriented to person, place, and time.  ?Psychiatric:     ?    Attention and Perception: Attention normal.     ?   Mood and Affect: Mood normal.     ?   Speech: Speech normal.     ?   Behavior: Behavior is cooperative.  ? ? ? ?Assessment and Plan:  ?Sandra Maynard is a 36 y.o. female presenting to the [facility] Department for STI screening ? ?1. Screening examination for venereal disease ?-36 year old female in clinic today for STD screening.  ?-Patient does have STI symptoms ?Patient accepted all screenings including  CT/GC and declines bloodwork for HIV/RPR.  ?Patient meets criteria for HepB screening? No. Ordered? No - refused ?Patient meets criteria for HepC screening? No. Ordered? No - refused ?Recommended condom use with all sex ?Discussed importance of condom use for STI prevent ? ?Treat gram stain per standing order ?Discussed time line for [facility] results and that patient will be called with positive results and encouraged patient to call if he had not heard in 2 weeks ?Recommended returning for continued or worsening symptoms.   ? ?- WET PREP FOR [place], YEAST, CLUE ?- Chlamydia/Gonorrhea [place] [facility] [place] [facility] ? ?2. Vaginal irritation ?-Vaginal irritation noted on physical exam.  Please give patient Clotrimazole for 7 days.   ?- clotrimazole (GYNE-LOTRIMIN) 1 % vaginal cream; Place 1 Applicatorful vaginally at bedtime for 7 days.  Dispense: 45 g; Refill: 0  ? ?Return if symptoms worsen or fail to improve. ? ? ? ?Gregary Cromer, FNP ?

## 2022-01-13 NOTE — Progress Notes (Signed)
Pt here for STD screening.  Wet mount results reviewed.  Medication dispensed per Provider orders.  Condoms given.  Truong Delcastillo M Usbaldo Pannone, RN ? ?

## 2022-01-15 ENCOUNTER — Other Ambulatory Visit: Payer: Self-pay

## 2022-01-15 ENCOUNTER — Emergency Department
Admission: EM | Admit: 2022-01-15 | Discharge: 2022-01-15 | Disposition: A | Discharge status: Home / Self Care | Payer: Self-pay | Attending: Emergency Medicine | Admitting: Emergency Medicine

## 2022-01-15 DIAGNOSIS — R300 Dysuria: Secondary | ICD-10-CM

## 2022-01-15 DIAGNOSIS — D72829 Elevated white blood cell count, unspecified: Secondary | ICD-10-CM | POA: Insufficient documentation

## 2022-01-15 DIAGNOSIS — N390 Urinary tract infection, site not specified: Secondary | ICD-10-CM | POA: Insufficient documentation

## 2022-01-15 LAB — URINALYSIS, ROUTINE W REFLEX MICROSCOPIC
Bacteria, UA: NONE SEEN
Bilirubin Urine: NEGATIVE
Glucose, UA: NEGATIVE mg/dL
Ketones, ur: NEGATIVE mg/dL
Nitrite: NEGATIVE
Protein, ur: 100 mg/dL — AB
Specific Gravity, Urine: 1.013 (ref 1.005–1.030)
WBC, UA: >50 WBC/hpf — ABNORMAL HIGH (ref 0–5)
pH: 6 (ref 5.0–8.0)

## 2022-01-15 LAB — PREGNANCY, URINE: Preg Test, Ur: NEGATIVE

## 2022-01-15 MED ORDER — CEPHALEXIN 500 MG PO CAPS: 500.0000 mg | ORAL_CAPSULE | Freq: Three times a day (TID) | ORAL | 0 refills | Status: AC

## 2022-01-15 MED ORDER — CEPHALEXIN 500 MG PO CAPS
500.0000 mg | ORAL_CAPSULE | Freq: Once | ORAL | Status: AC
Start: 2022-01-15 — End: 2022-01-15
  Administered 2022-01-15: 500 mg via ORAL
  Filled 2022-01-15: qty 1

## 2022-01-15 MED ORDER — PHENAZOPYRIDINE HCL 200 MG PO TABS: 200.0000 mg | ORAL_TABLET | Freq: Three times a day (TID) | ORAL | 0 refills | Status: AC | PRN

## 2022-01-15 MED ORDER — PHENAZOPYRIDINE HCL 200 MG PO TABS
200.0000 mg | ORAL_TABLET | Freq: Once | ORAL | Status: AC
  Administered 2022-01-15: 200 mg via ORAL
  Filled 2022-01-15: qty 1

## 2022-01-15 NOTE — Discharge Instructions (Signed)
1.  Take antibiotic as prescribed (Keflex 500 mg 3 times daily x7 days). °2.  You may take Pyridium as prescribed for urinary discomfort. °3.  Return to the ER for worsening symptoms, persistent vomiting, fever or other concerns. °

## 2022-01-15 NOTE — ED Provider Notes (Signed)
? ?[facility] ?Provider Note ? ? ? Event Date/Time  ? First MD Initiated Contact with Patient 01/15/22 [phone]   ?  (approximate) ? ? ?History  ? ?Urinary Frequency ? ? ?HPI ? ?History obtained via tele-Spanish interpreter ? ?Sandra Maynard is a 36 y.o. female who presents to the ED from home with a chief complaint of dysuria and itching to vaginal area x1 week.  Denies fever, chills, abdominal/flank pain, nausea or vomiting. ?  ? ? ?Past Medical History  ? ?Past Medical History:  ?Diagnosis Date  ? Anemia   ? 2012  ? Depression affecting pregnancy 12/11/2020  ? Gestational diabetes 04/26/21 04/29/2021  ? Migraine   ? Preeclampsia 05/24/2021  ? ? ? ?Active Problem List  ? ?Patient Active Problem List  ? Diagnosis Date Noted  ? Umbilical hernia without obstruction and without gangrene   ? Cholecystitis   ? Hx of tubal ligation 05/29/21 07/17/2021  ? Admission for sterilization   ? Preeclampsia--severe   Procardia 60 mg XL daily 05/24/2021  ? Gestational diabetes 04/26/21 04/29/2021  ? UTI (urinary tract infection) during pregnancy dx'd [facility] ER on 01/29/21 02/08/2021  ? Poor historian 02/08/2021  ? Proteinuria affecting pregnancy 12/11/20 spot protein/creat ratio=2,485 12/14/2020  ? Obesity affecting pregnancy BMI=35.6 12/11/2020  ? Supervision of high risk pregnancy in second trimester 12/11/2020  ? Late prenatal care 14 3/7 12/11/2020  ? History of preterm delivery 12/01/13 34 wks & 02/07/15 at 36 12/11/2020  ? Low birth weight infant 5 lbs 02/07/15 12/11/2020  ? Hx macrosomic infant 9 lbs 06/17/11 12/11/2020  ? Depression affecting pregnancy 12/11/2020  ? Hemorrhage after delivery of fetus on 06/17/11 500 cc 12/11/2020  ? Nerve damage to left hand as a child 12/11/2020  ? Low-level of literacy 6th grade 12/06/2014  ? History of cesarean section (flat/narrow AP pelvis per Dr. Logan Bores) 08/10/2014  ? ? ? ?Past Surgical History  ? ?Past Surgical History:  ?Procedure Laterality Date  ? CESAREAN SECTION  2012  ?  CESAREAN SECTION WITH BILATERAL TUBAL LIGATION Bilateral 05/29/2021  ? Procedure: CESAREAN SECTION WITH BILATERAL TUBAL LIGATION;  Surgeon: Nadara Mustard, MD;  Location: [facility] ORS;  Service: Obstetrics;  Laterality: Bilateral;  ? CHOLECYSTECTOMY    ? UMBILICAL HERNIA REPAIR N/A 11/19/2021  ? Procedure: HERNIA REPAIR UMBILICAL ADULT, open;  Surgeon: Henrene Dodge, MD;  Location: [facility] ORS;  Service: General;  Laterality: N/A;  ? ? ? ?Home Medications  ? ?Prior to Admission medications   ?Medication Sig Start Date End Date Taking? Authorizing Provider  ?cephALEXin (KEFLEX) 500 MG capsule Take 1 capsule (500 mg total) by mouth 3 (three) times daily. 01/15/22  Yes Irean Hong, MD  ?phenazopyridine (PYRIDIUM) 200 MG tablet Take 1 tablet (200 mg total) by mouth 3 (three) times daily as needed for pain. 01/15/22  Yes Irean Hong, MD  ?acetaminophen (TYLENOL) 500 MG tablet Take 2 tablets (1,000 mg total) by mouth every 6 (six) hours as needed for mild pain. ?Patient not taking: Reported on 01/13/2022 11/19/21   Henrene Dodge, MD  ?clotrimazole (GYNE-LOTRIMIN) 1 % vaginal cream Place 1 Applicatorful vaginally at bedtime for 7 days. 01/13/22 01/20/22  Glenna Fellows, FNP  ?ibuprofen (ADVIL) 800 MG tablet Take 1 tablet (800 mg total) by mouth every 8 (eight) hours as needed for moderate pain. ?Patient not taking: Reported on 01/13/2022 11/19/21   Henrene Dodge, MD  ?NIFEdipine (ADALAT CC) 60 MG 24 hr tablet TAKE 1 TABLET BY MOUTH EVERY  DAY ?Patient not taking: Reported on 01/13/2022 07/02/21   Will Bonnet, MD  ? ? ? ?Allergies  ?Patient has no known allergies. ? ? ?Family History  ? ?Family History  ?Problem Relation Age of Onset  ? Hypertension Mother   ? ? ? ?Physical Exam  ?Triage Vital Signs: ?ED Triage Vitals [01/15/22 0107]  ?Enc Vitals Group  ?   BP (!) 155/94  ?   Pulse Rate 96  ?   Resp 18  ?   Temp 98.8 ?F (37.1 ?C)  ?   Temp Source Oral  ?   SpO2 96 %  ?   Weight   ?   Height   ?   Head Circumference   ?   Peak Flow   ?   Pain  Score   ?   Pain Loc   ?   Pain Edu?   ?   Excl. in [place]?   ? ? ?Updated Vital Signs: ?BP (!) 155/94 (BP Location: Left Arm)   Pulse 96   Temp 98.8 ?F (37.1 ?C) (Oral)   Resp 18   LMP 01/04/2022   SpO2 96%  ? ? ?General: Awake, no distress.  ?CV:  Good peripheral perfusion.  ?Resp:  Normal effort.  ?Abd:  Nontender to light or deep palpation.  No CVAT.  No distention.   ?Other:  No vesicles. ? ? ?ED Results / Procedures / Treatments  ?Labs ?(all labs ordered are listed, but only abnormal results are displayed) ?Labs Reviewed  ?URINALYSIS, ROUTINE W REFLEX MICROSCOPIC - Abnormal; Notable for the following components:  ?    Result Value  ? Color, Urine YELLOW (*)   ? APPearance TURBID (*)   ? Hgb urine dipstick SMALL (*)   ? Protein, ur 100 (*)   ? Leukocytes,Ua LARGE (*)   ? WBC, UA >50 (*)   ? All other components within normal limits  ?URINE CULTURE  ?PREGNANCY, URINE  ? ? ? ?EKG ? ?None ? ? ?RADIOLOGY ?None ? ? ?Official radiology report(s): ?No results found. ? ? ?PROCEDURES: ? ?Critical Care performed: No ? ?Procedures ? ? ?MEDICATIONS ORDERED IN ED: ?Medications  ?cephALEXin (KEFLEX) capsule 500 mg (has no administration in time range)  ?phenazopyridine (PYRIDIUM) tablet 200 mg (has no administration in time range)  ? ? ? ?IMPRESSION / MDM / ASSESSMENT AND PLAN / ED COURSE  ?I reviewed the triage vital signs and the nursing notes. ?             ?               ?36 year old otherwise healthy female presenting with a week history of dysuria.  UA is positive for large leukocytes and greater than 50 WBC.  Urine pregnancy is negative.  Will start Keflex, Pyridium and patient will follow-up closely with her PCP.  Urine culture is pending.  Strict return precautions given.  Patient verbalizes understanding and agrees with plan of care. ? ?FINAL CLINICAL IMPRESSION(S) / ED DIAGNOSES  ? ?Final diagnoses:  ?Dysuria  ?Lower urinary tract infectious disease  ? ? ? ?Rx / DC Orders  ? ?ED Discharge Orders   ? ?       Ordered  ?  cephALEXin (KEFLEX) 500 MG capsule  3 times daily       ? 01/15/22 0524  ?  phenazopyridine (PYRIDIUM) 200 MG tablet  3 times daily PRN       ? 01/15/22 0524  ? ?  ?  ? ?  ? ? ? ?  Note:  This document was prepared using Dragon voice recognition software and may include unintentional dictation errors. ?  ?Paulette Blanch, MD ?01/15/22 [phone] ? ?

## 2022-01-15 NOTE — ED Notes (Signed)
Pt discharge information reviewed. Pt understands need for follow up care and when to return if symptoms worsen. Medications discussed. All information went over using interpretor. All questions answered. Pt is alert and oriented with even and regular respirations. Pt is seen ambulating out of department with string steady gait.   ?

## 2022-01-15 NOTE — ED Triage Notes (Signed)
Ambulatory to triage. Video interpretor service in use. Pt c/o burning when urinating and itching to vaginal area. Sx ongoing x 1 week. Denies abd pain, back pain or fever.  ?

## 2022-01-17 LAB — URINE CULTURE: Culture: 50000 — AB

## 2022-10-17 IMAGING — US US MFM OB COMP +14 WKS
1 series · 13 of 28 positions shown · non-contrast
Comparison: none

[Series 1: us mfm ob comp +14 wks · 13 of 72 slices shown]
[im 3/72]
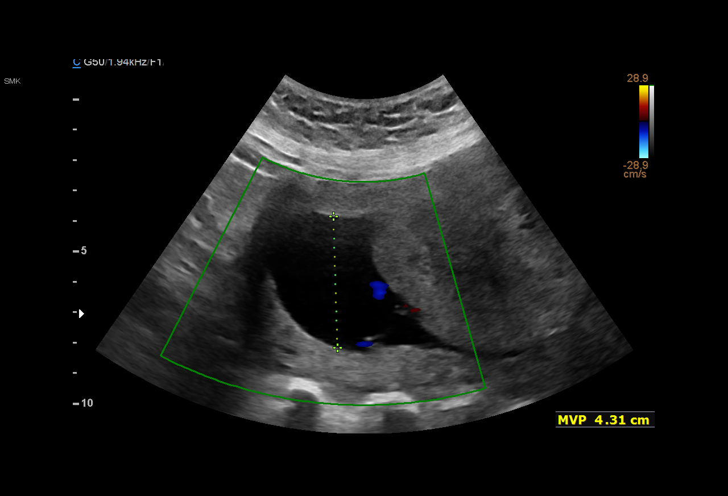
[im 8/72]
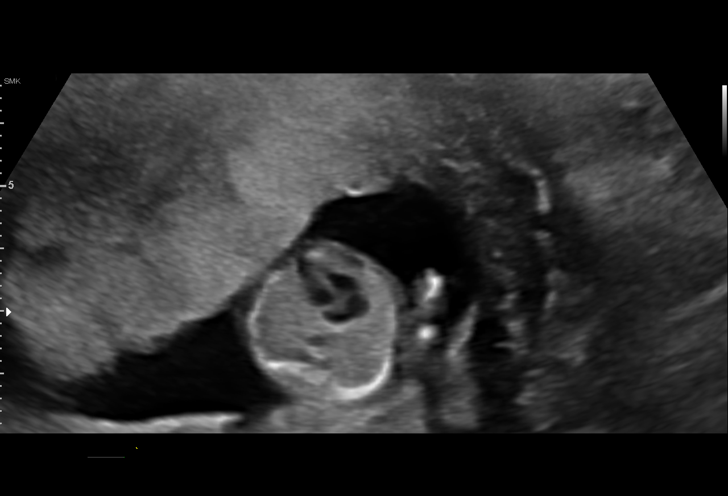
[im 14/72]
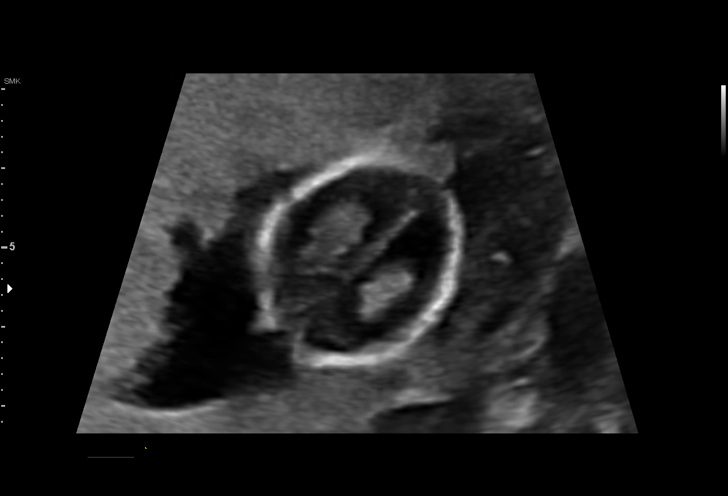
[im 19/72]
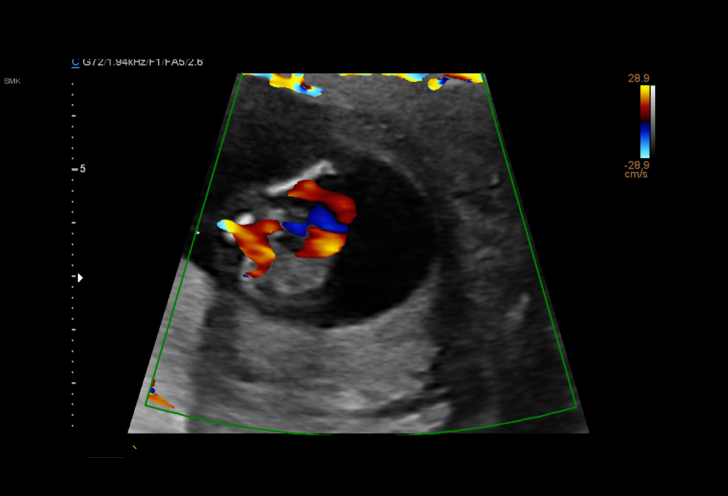
[im 24/72]
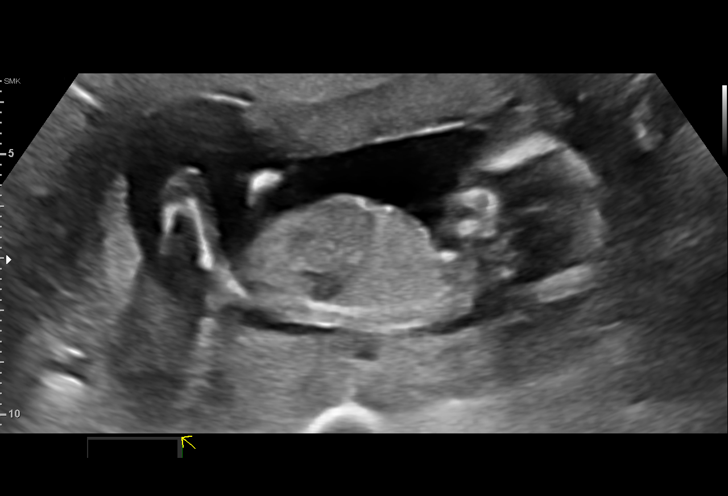
[im 29/72]
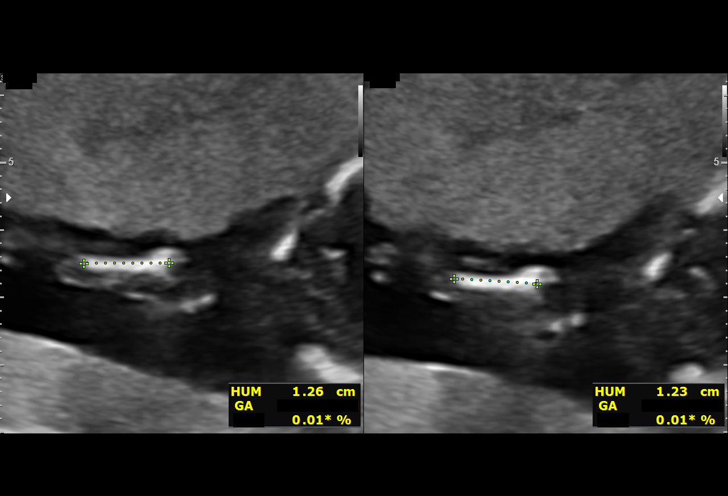
[im 37/72]
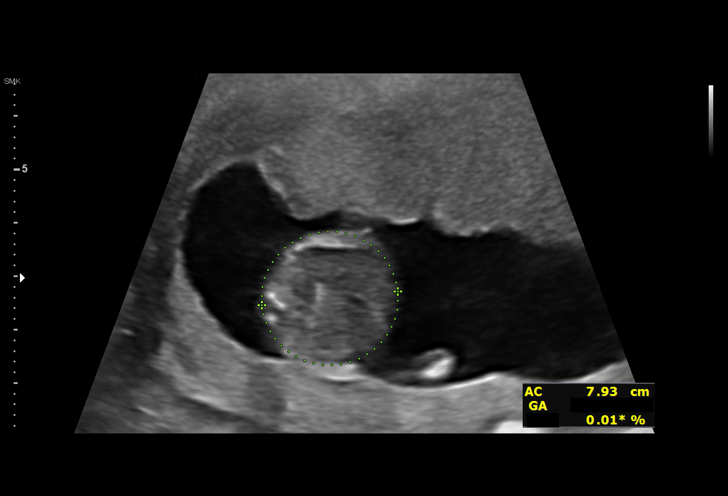
[im 43/72]
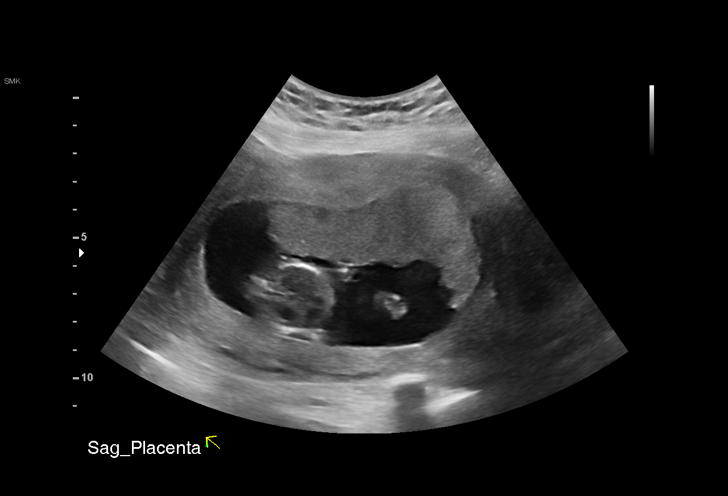
[im 48/72]
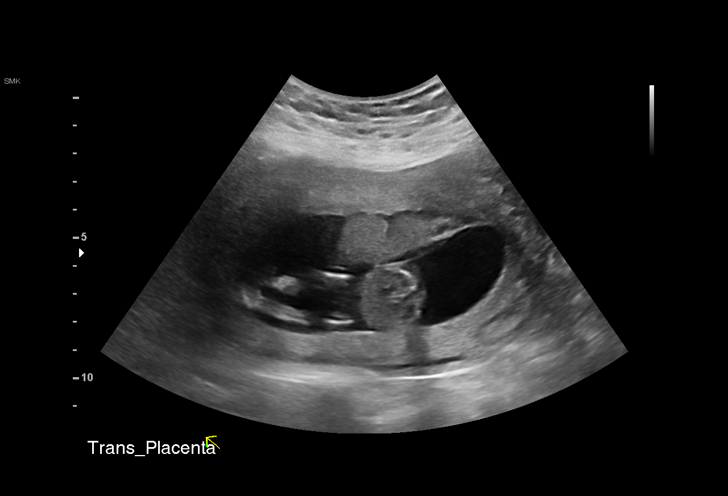
[im 53/72]
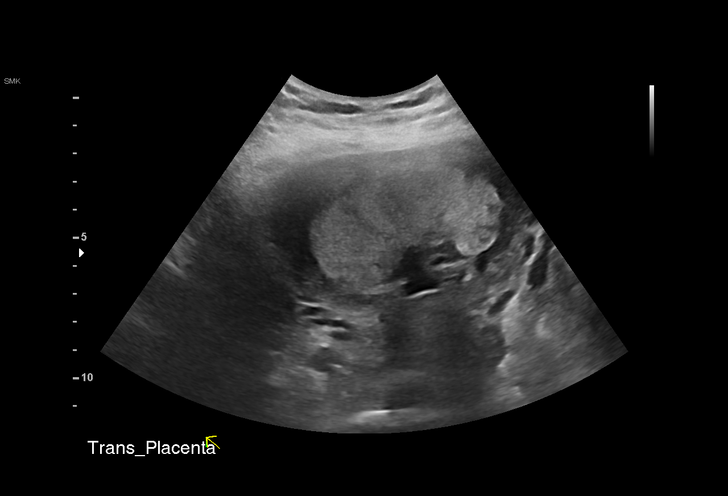
[im 58/72]
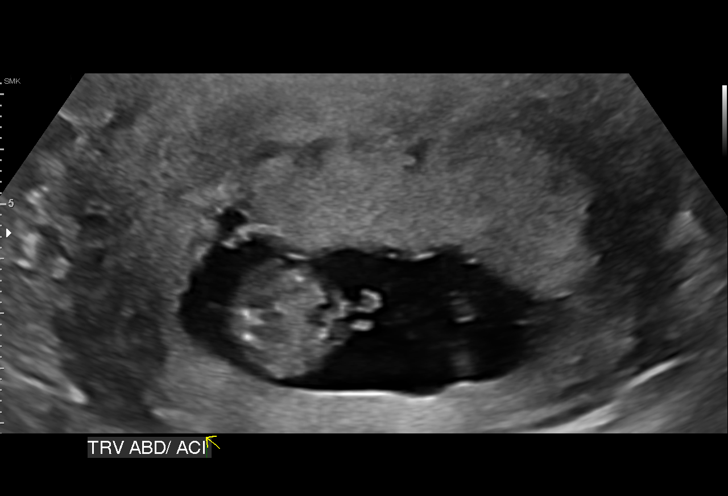
[im 64/72]
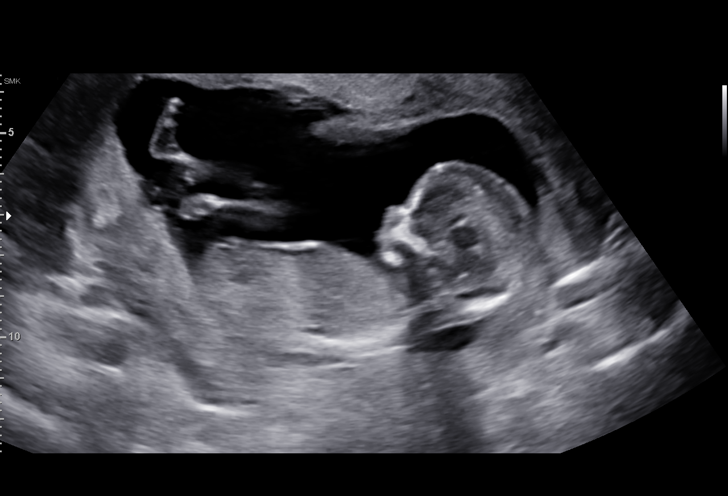
[im 69/72]
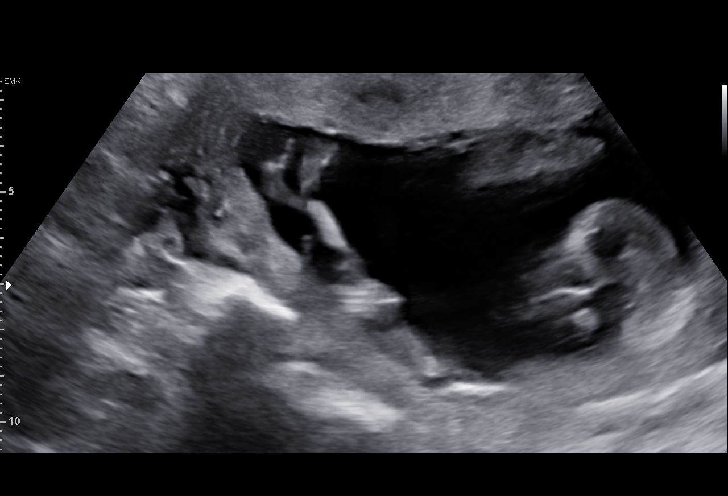

[13 of 28 positions shown; findings below may reference images not displayed]

KELBY

                   TRAN CNM

 1  US MFM OB COMP + 14 WK                76805.01    MIKI ENAMORADO

Indications

 Obesity complicating pregnancy, second
 trimester
 14 weeks gestation of pregnancy
 Previous cesarean delivery, antepartum
 Late prenatal care, second trimester
Fetal Evaluation

 Num Of Fetuses:         1
 Fetal Heart Rate(bpm):  160
 Cardiac Activity:       Observed
 Placenta:               Anterior

                             Largest Pocket(cm)

Biometry

 CRL:      89.5  mm     G. Age:  N/A                     EDD:

 BPD:      28.3  mm     G. Age:  15w 1d                  CI:        72.99   %    70 - 86
                                                         FL/HC:      15.1   %    15.3 -
 HC:      105.3  mm     G. Age:  15w 0d                  HC/AC:      1.34        1.05 -
 AC:       78.4  mm     G. Age:  14w 2d                  FL/BPD:     56.2   %
 FL:       15.9  mm     G. Age:  14w 5d                  FL/AC:      20.3   %    20 - 24
 HUM:      12.7  mm     G. Age:  13w 3d

 Est. FW:     100  gm      0 lb 4 oz
Gestational Age

 LMP:           19w 3d        Date:  09/01/20                 EDD:   06/08/21
 U/S Today:     14w 6d                                        EDD:   07/10/21
 Best:          14w 6d     Det. By:  U/S (01/15/21)           EDD:   07/10/21
Anatomy

 Choroid Plexus:        Appears normal         Stomach:                Appears normal, left
                                                                       sided
 LVOT:                  Appears normal         Bladder:                Appears normal
Cervix Uterus Adnexa

 Cervix
 Length:           3.69  cm.

 Right Ovary
 Not visualized.

 Left Ovary
 Within normal limits.
Comments

 This patient was seen for an ultrasound exam due to
 significant proteinuria that was noted during her prenatal
 visits.  The patient had a 24-hour urine collected earlier in her
 current pregnancy that showed 9491 mg of protein.  Her
 serum creatinine level was 0.57 (within normal limits).  The
 patient's liver function tests and platelet counts were within
 normal limits.  Her 1 hour glucose challenge test was
 elevated.  She has a 3-hour glucose tolerance test pending.
 Her hemoglobin A1c was 5.4% (within normal limits).  The
 patient denies any history of kidney disease or high blood
 pressure.  She was seen by [HOSPITAL] nephrology today.  They
 report that the cause of her significant proteinuria remains
 undetermined.  They will order a renal ultrasound for her
 soon.
 This is the patient's fourth pregnancy.  She reports three prior
 uncomplicated full-term deliveries (NSVD x2, C-section x1).
 She denies any history of high blood pressure or
 preeclampsia in her prior pregnancies.
 The patient had blood work drawn for quad screen a few
 weeks ago.  However, she was too early in her pregnancy for
 the quad screen to show any results.
 The fetal biometry measurements obtained today are
 consistent with an EDC July 10, 2021, making her
 only 14 weeks and 6 days today.  This finding would be
 consistent with her quad screen results.
 The patient was advised that as there is a 4-week difference
 between the EDC based on her LMP and that based on
 today's ultrasound, we are changing her EDC to [place]
 The views of the fetal anatomy were limited today due to her
 early gestational age.
 The patient should have a quad screen drawn at around 16
 weeks to screen for fetal aneuploidy.
 We have scheduled a detailed fetal anatomy scan for her at
 around 19 weeks.
 The increased risk of preeclampsia and possible fetal growth
 issues associated with significant proteinuria early in
 pregnancy was discussed.  She was advised to start taking a
 daily baby aspirin (81 mg daily) for preeclampsia prophylaxis.
 We will continue to follow her with monthly growth
 ultrasounds following her detailed fetal anatomy scan.  The
 patient and her husband were reassured that most patients
 that I have seen with significant proteinuria but without
 chronic hypertension or abnormal serum creatinine levels,
 have had normal and successful pregnancy outcomes.
 All conversations were held with the patient today with the
 help of a Spanish interpreter.
 Total time spent in consultation : 40 minutes
Recommendations

 Change EDC to July 10, 2021
 Start baby aspirin (81 mg) daily
 Quad screen to be drawn at around 16 weeks
 Detailed fetal anatomy scan at 19
 Monthly growth ultrasounds

## 2022-10-23 ENCOUNTER — Other Ambulatory Visit: Payer: Self-pay | Admitting: Advanced Practice Midwife
